# Patient Record
Sex: Male | Born: 1980 | Race: Black or African American | Hispanic: No | Marital: Married | Smoking: Never smoker
Health system: Southern US, Community
[De-identification: ages and names within clinical notes are randomized; demographics above are authoritative.]

## PROBLEM LIST (undated history)

## (undated) HISTORY — PX: BACK SURGERY: SHX140

---

## 2003-10-16 ENCOUNTER — Emergency Department (HOSPITAL_COMMUNITY): Admission: EM | Admit: 2003-10-16 | Discharge: 2003-10-17 | Payer: Self-pay | Admitting: *Deleted

## 2003-11-18 ENCOUNTER — Ambulatory Visit (HOSPITAL_COMMUNITY): Admission: RE | Admit: 2003-11-18 | Discharge: 2003-11-18 | Payer: Self-pay | Admitting: *Deleted

## 2004-02-02 ENCOUNTER — Emergency Department (HOSPITAL_COMMUNITY): Admission: EM | Admit: 2004-02-02 | Discharge: 2004-02-02 | Payer: Self-pay | Admitting: Emergency Medicine

## 2004-08-23 ENCOUNTER — Ambulatory Visit (HOSPITAL_COMMUNITY): Admission: RE | Admit: 2004-08-23 | Discharge: 2004-08-23 | Payer: Self-pay | Admitting: Family Medicine

## 2004-08-26 ENCOUNTER — Emergency Department (HOSPITAL_COMMUNITY): Admission: EM | Admit: 2004-08-26 | Discharge: 2004-08-27 | Payer: Self-pay | Admitting: Emergency Medicine

## 2012-02-16 ENCOUNTER — Emergency Department (HOSPITAL_COMMUNITY)
Admission: EM | Admit: 2012-02-16 | Discharge: 2012-02-16 | Disposition: A | Discharge status: Home / Self Care | Payer: No Typology Code available for payment source | Attending: Emergency Medicine | Admitting: Emergency Medicine

## 2012-02-16 ENCOUNTER — Encounter (HOSPITAL_COMMUNITY): Payer: Self-pay

## 2012-02-16 DIAGNOSIS — M549 Dorsalgia, unspecified: Secondary | ICD-10-CM | POA: Insufficient documentation

## 2012-02-16 DIAGNOSIS — T148XXA Other injury of unspecified body region, initial encounter: Secondary | ICD-10-CM | POA: Insufficient documentation

## 2012-02-16 DIAGNOSIS — X58XXXA Exposure to other specified factors, initial encounter: Secondary | ICD-10-CM | POA: Insufficient documentation

## 2012-02-16 MED ORDER — IBUPROFEN 800 MG PO TABS
800.0000 mg | ORAL_TABLET | Freq: Once | ORAL | Status: AC
  Administered 2012-02-16: 800 mg via ORAL
  Filled 2012-02-16: qty 1

## 2012-02-16 MED ORDER — IBUPROFEN 800 MG PO TABS: 800.0000 mg | ORAL_TABLET | Freq: Three times a day (TID) | ORAL | Status: DC

## 2012-02-16 NOTE — ED Provider Notes (Signed)
History     CSN: [id number]  Arrival date & time 02/16/12  0042   First MD Initiated Contact with Patient 02/16/12 0106      Chief Complaint  Patient presents with  . Back Pain    (Consider location/radiation/quality/duration/timing/severity/associated sxs/prior treatment) HPI  Jerome Sutton is a 31 y.o. male who presents to the Emergency Department complaining of right sided lower back pain that began with lifting boxes onto pallets earlier today. He has taken tylenol without relief. Pain worse with movement. Denies numbness, tingling, weakness of leg.    History reviewed. No pertinent past medical history.  History reviewed. No pertinent past surgical history.  No family history on file.  History  Substance Use Topics  . Smoking status: Never Smoker   . Smokeless tobacco: Not on file  . Alcohol Use: No      Review of Systems  Constitutional: Negative for fever.       10 Systems reviewed and are negative for acute change except as noted in the HPI.  HENT: Negative for congestion.   Eyes: Negative for discharge and redness.  Respiratory: Negative for cough and shortness of breath.   Cardiovascular: Negative for chest pain.  Gastrointestinal: Negative for vomiting and abdominal pain.  Musculoskeletal: Positive for back pain.  Skin: Negative for rash.  Neurological: Negative for syncope, numbness and headaches.  Psychiatric/Behavioral:       No behavior change.    Allergies  Review of patient's allergies indicates not on file.  Home Medications  No current outpatient prescriptions on file.  BP 146/95  Pulse 97  Temp 98.2 F (36.8 C) (Oral)  Resp 18  Ht 5\' 7"  (1.702 m)  Wt 255 lb (115.667 kg)  BMI 39.94 kg/m2  SpO2 96%  Physical Exam  Nursing note and vitals reviewed. Constitutional: He appears well-developed and well-nourished.       Awake, alert, nontoxic appearance.  HENT:  Head: Atraumatic.  Eyes: Right eye exhibits no discharge. Left eye  exhibits no discharge.  Neck: Neck supple.  Cardiovascular: Normal heart sounds.   Pulmonary/Chest: Effort normal and breath sounds normal. He exhibits no tenderness.  Abdominal: Soft. There is no tenderness. There is no rebound.  Musculoskeletal: He exhibits no tenderness.       Baseline ROM, no obvious new focal weakness.Mild tenderness to palpation over the right lower lower back. Able to bend over toward his toes. Lateral bending with mild discomfort.   Neurological:       Mental status and motor strength appears baseline for patient and situation.  Skin: No rash noted.  Psychiatric: He has a normal mood and affect.    ED Course  Procedures (including critical care time)    MDM  Patient with lower back pain as a result of moving boxes. Given ibuprofen. Pt stable in ED with no significant deterioration in condition.The patient appears reasonably screened and/or stabilized for discharge and I doubt any other medical condition or other [facility] requiring further screening, evaluation, or treatment in the ED at this time prior to discharge.  MDM Reviewed: nursing note and vitals           Nicoletta Dress. Colon Branch, MD 02/16/12 [id number]

## 2012-02-16 NOTE — ED Notes (Signed)
Was at work, moving boxes on a pallet, and it started there and got worse per pt.

## 2012-02-16 NOTE — ED Notes (Signed)
Requested a note to be out of work this weekend Saturday and Sunday.  Provided.

## 2012-02-17 ENCOUNTER — Emergency Department (HOSPITAL_COMMUNITY)
Admission: EM | Admit: 2012-02-17 | Discharge: 2012-02-17 | Disposition: A | Discharge status: Home / Self Care | Payer: No Typology Code available for payment source | Attending: Emergency Medicine | Admitting: Emergency Medicine

## 2012-02-17 ENCOUNTER — Encounter (HOSPITAL_COMMUNITY): Payer: Self-pay | Admitting: *Deleted

## 2012-02-17 ENCOUNTER — Emergency Department (HOSPITAL_COMMUNITY): Payer: No Typology Code available for payment source

## 2012-02-17 DIAGNOSIS — S39012A Strain of muscle, fascia and tendon of lower back, initial encounter: Secondary | ICD-10-CM

## 2012-02-17 DIAGNOSIS — M549 Dorsalgia, unspecified: Secondary | ICD-10-CM | POA: Insufficient documentation

## 2012-02-17 MED ORDER — HYDROCODONE-ACETAMINOPHEN 5-325 MG PO TABS
2.0000 | ORAL_TABLET | Freq: Once | ORAL | Status: AC
  Administered 2012-02-17: 2 via ORAL
  Filled 2012-02-17: qty 2

## 2012-02-17 MED ORDER — PREDNISONE 20 MG PO TABS: ORAL_TABLET | ORAL | Status: DC

## 2012-02-17 MED ORDER — PREDNISONE 20 MG PO TABS
60.0000 mg | ORAL_TABLET | Freq: Once | ORAL | Status: AC
  Administered 2012-02-17: 60 mg via ORAL
  Filled 2012-02-17: qty 3

## 2012-02-17 MED ORDER — HYDROCODONE-ACETAMINOPHEN 5-500 MG PO TABS: 1.0000 | ORAL_TABLET | Freq: Four times a day (QID) | ORAL | Status: DC | PRN

## 2012-02-17 NOTE — ED Provider Notes (Signed)
History     CSN: [id number]  Arrival date & time 02/17/12  7829   First MD Initiated Contact with Patient 02/17/12 0654      Chief Complaint  Patient presents with  . Back Pain    (Consider location/radiation/quality/duration/timing/severity/associated sxs/prior treatment) Patient is a 31 y.o. male presenting with back pain. The history is provided by the patient.  Back Pain  Pertinent negatives include no fever, no numbness, no headaches, no abdominal pain and no weakness.  pt c/o lower back pain radiating to right leg for past 2 days. States 2 days ago at work was doing a lot of bending/lifting-  Felt as if strained back. Hx intermittent back pain in past, no prior back surgery. No numbness or weakness. No incontinence or retention. Denies fever or chills. Went to [facility] ED yesterday. States the ibuprofen is not controlling pain and that no xrays were done.   History reviewed. No pertinent past medical history.  History reviewed. No pertinent past surgical history.  History reviewed. No pertinent family history.  History  Substance Use Topics  . Smoking status: Never Smoker   . Smokeless tobacco: Not on file  . Alcohol Use: No      Review of Systems  Constitutional: Negative for fever and chills.  Cardiovascular: Negative for leg swelling.  Gastrointestinal: Negative for nausea, vomiting and abdominal pain.  Musculoskeletal: Positive for back pain.  Skin: Negative for rash.  Neurological: Negative for weakness, numbness and headaches.    Allergies  Review of patient's allergies indicates no known allergies.  Home Medications  No current outpatient prescriptions on file.  BP 189/95  Pulse 101  Temp 98.6 F (37 C) (Oral)  Resp 16  SpO2 97%  Physical Exam  Nursing note and vitals reviewed. Constitutional: He is oriented to person, place, and time. He appears well-developed and well-nourished. No distress.  HENT:  Head: Atraumatic.  Nose: Nose normal.  Eyes:  Conjunctivae are normal.  Neck: Neck supple. No tracheal deviation present.  Cardiovascular: Normal rate and intact distal pulses.   Pulmonary/Chest: Effort normal. No accessory muscle usage. No respiratory distress.  Abdominal: Soft. He exhibits no distension. There is no tenderness.  Genitourinary:       No cva tenderness  Musculoskeletal: Normal range of motion. He exhibits no edema and no tenderness.       Diffuse lumbar tenderness, otherwise, CTLS spine, non tender, aligned, no step off.   Neurological: He is alert and oriented to person, place, and time. He displays normal reflexes.       Straight leg raise neg. Motor intact bil. Steady gait.   Skin: Skin is warm and dry.  Psychiatric: He has a normal mood and affect.    ED Course  Procedures (including critical care time)  Dg Lumbar Spine Complete  02/17/2012  *RADIOLOGY REPORT*  Clinical Data: Back pain.  LUMBAR SPINE - COMPLETE 4+ VIEW  Comparison: None.  Findings: AP, lateral and oblique images of the lumbar spine were obtained.  There are mild degenerative endplate changes at L5-S1. The vertebral body heights are maintained.  Normal alignment of the lumbar spine.  No evidence for acute fracture.  IMPRESSION: No acute bony abnormality.   Original Report Authenticated By: Richarda Overlie, M.D.       MDM  Pt has ride, does not have to drive. vicodin po. pred po. Xr.    Recheck pt comfortable.     Suzi Roots, MD 02/17/12 [phone]

## 2012-02-17 NOTE — ED Notes (Signed)
MD at bedside. 

## 2012-02-17 NOTE — ED Notes (Signed)
Pt states back pain since this past Friday at work. Pt states intermitant pain and mid back and down right leg.

## 2012-02-18 ENCOUNTER — Encounter (HOSPITAL_COMMUNITY): Payer: Self-pay

## 2012-02-18 ENCOUNTER — Encounter (HOSPITAL_COMMUNITY): Payer: Self-pay | Admitting: Certified Registered"

## 2012-02-18 ENCOUNTER — Emergency Department (HOSPITAL_COMMUNITY): Payer: No Typology Code available for payment source | Admitting: Certified Registered"

## 2012-02-18 ENCOUNTER — Emergency Department (HOSPITAL_COMMUNITY): Payer: No Typology Code available for payment source

## 2012-02-18 ENCOUNTER — Encounter (HOSPITAL_COMMUNITY)
Admission: EM | Disposition: A | Discharge status: Home / Self Care | Payer: Self-pay | Source: Home / Self Care | Attending: Neurosurgery

## 2012-02-18 ENCOUNTER — Inpatient Hospital Stay (HOSPITAL_COMMUNITY)
Admission: EM | Admit: 2012-02-18 | Discharge: 2012-02-20 | DRG: 490 | Disposition: A | Discharge status: Home / Self Care | Payer: No Typology Code available for payment source | Attending: Neurosurgery | Admitting: Neurosurgery

## 2012-02-18 ENCOUNTER — Inpatient Hospital Stay (HOSPITAL_COMMUNITY): Payer: No Typology Code available for payment source

## 2012-02-18 DIAGNOSIS — X500XXA Overexertion from strenuous movement or load, initial encounter: Secondary | ICD-10-CM | POA: Diagnosis present

## 2012-02-18 DIAGNOSIS — G834 Cauda equina syndrome: Secondary | ICD-10-CM

## 2012-02-18 DIAGNOSIS — S33101A Dislocation of unspecified lumbar vertebra, initial encounter: Principal | ICD-10-CM | POA: Diagnosis present

## 2012-02-18 DIAGNOSIS — Y9269 Other specified industrial and construction area as the place of occurrence of the external cause: Secondary | ICD-10-CM

## 2012-02-18 HISTORY — PX: LUMBAR LAMINECTOMY/DECOMPRESSION MICRODISCECTOMY: SHX5026

## 2012-02-18 LAB — CBC WITH DIFFERENTIAL/PLATELET
Basophils Absolute: 0 K/uL (ref 0.0–0.1)
Basophils Relative: 0 % (ref 0–1)
Eosinophils Absolute: 0 10*3/uL (ref 0.0–0.7)
Eosinophils Relative: 0 % (ref 0–5)
HCT: 46 % (ref 39.0–52.0)
Hemoglobin: 15.7 g/dL (ref 13.0–17.0)
Lymphocytes Relative: 5 % — ABNORMAL LOW (ref 12–46)
Lymphs Abs: 0.6 10*3/uL — ABNORMAL LOW (ref 0.7–4.0)
MCH: 29.3 pg (ref 26.0–34.0)
MCHC: 34.1 g/dL (ref 30.0–36.0)
MCV: 85.8 fL (ref 78.0–100.0)
Monocytes Absolute: 0.4 K/uL (ref 0.1–1.0)
Monocytes Relative: 3 % (ref 3–12)
Neutro Abs: 11.9 10*3/uL — ABNORMAL HIGH (ref 1.7–7.7)
Neutrophils Relative %: 93 % — ABNORMAL HIGH (ref 43–77)
Platelets: 244 10*3/uL (ref 150–400)
RBC: 5.36 MIL/uL (ref 4.22–5.81)
RDW: 12.2 % (ref 11.5–15.5)
WBC: 12.8 K/uL — ABNORMAL HIGH (ref 4.0–10.5)

## 2012-02-18 LAB — URINALYSIS, ROUTINE W REFLEX MICROSCOPIC
Bilirubin Urine: NEGATIVE
Glucose, UA: NEGATIVE mg/dL
Hgb urine dipstick: NEGATIVE
Ketones, ur: 15 mg/dL — AB
Leukocytes, UA: NEGATIVE
Nitrite: NEGATIVE
Protein, ur: NEGATIVE mg/dL
Specific Gravity, Urine: 1.028 (ref 1.005–1.030)
Urobilinogen, UA: 1 mg/dL (ref 0.0–1.0)
pH: 6 (ref 5.0–8.0)

## 2012-02-18 LAB — BASIC METABOLIC PANEL
BUN: 14 mg/dL (ref 6–23)
GFR calc Af Amer: >90 mL/min (ref >90)
GFR calc non Af Amer: >90 mL/min (ref >90)
Potassium: 4.4 mEq/L (ref 3.5–5.1)

## 2012-02-18 LAB — BASIC METABOLIC PANEL WITH GFR
CO2: 26 meq/L (ref 19–32)
Calcium: 9.5 mg/dL (ref 8.4–10.5)
Chloride: 101 meq/L (ref 96–112)
Creatinine, Ser: 0.83 mg/dL (ref 0.50–1.35)
Glucose, Bld: 137 mg/dL — ABNORMAL HIGH (ref 70–99)
Sodium: 137 meq/L (ref 135–145)

## 2012-02-18 LAB — PROTIME-INR
INR: 0.99 (ref 0.00–1.49)
Prothrombin Time: 13.3 seconds (ref 11.6–15.2)

## 2012-02-18 LAB — RAPID URINE DRUG SCREEN, HOSP PERFORMED
Amphetamines: NOT DETECTED
Barbiturates: NOT DETECTED
Tetrahydrocannabinol: NOT DETECTED

## 2012-02-18 SURGERY — LUMBAR LAMINECTOMY/DECOMPRESSION MICRODISCECTOMY 1 LEVEL
Anesthesia: General | Site: Back | Laterality: Bilateral | Wound class: Clean

## 2012-02-18 MED ORDER — GLYCOPYRROLATE 0.2 MG/ML IJ SOLN
INTRAMUSCULAR | Status: DC | PRN
  Administered 2012-02-18: .8 mg via INTRAVENOUS

## 2012-02-18 MED ORDER — ONDANSETRON HCL 4 MG/2ML IJ SOLN
INTRAMUSCULAR | Status: DC | PRN
  Administered 2012-02-18: 4 mg via INTRAVENOUS

## 2012-02-18 MED ORDER — NEOSTIGMINE METHYLSULFATE 1 MG/ML IJ SOLN
INTRAMUSCULAR | Status: DC | PRN
  Administered 2012-02-18: 5 mg via INTRAVENOUS

## 2012-02-18 MED ORDER — MORPHINE SULFATE 4 MG/ML IJ SOLN
4.0000 mg | INTRAMUSCULAR | Status: DC | PRN
  Administered 2012-02-18: 4 mg via INTRAVENOUS
  Filled 2012-02-18: qty 1

## 2012-02-18 MED ORDER — LIDOCAINE HCL (CARDIAC) 20 MG/ML IV SOLN
INTRAVENOUS | Status: DC | PRN
  Administered 2012-02-18: 40 mg via INTRAVENOUS

## 2012-02-18 MED ORDER — DEXAMETHASONE SODIUM PHOSPHATE 4 MG/ML IJ SOLN
INTRAMUSCULAR | Status: DC | PRN
  Administered 2012-02-18: 10 mg via INTRAVENOUS

## 2012-02-18 MED ORDER — HEMOSTATIC AGENTS (NO CHARGE) OPTIME
TOPICAL | Status: DC | PRN
  Administered 2012-02-18: 1 via TOPICAL

## 2012-02-18 MED ORDER — VECURONIUM BROMIDE 10 MG IV SOLR
INTRAVENOUS | Status: DC | PRN
  Administered 2012-02-18: 1 mg via INTRAVENOUS

## 2012-02-18 MED ORDER — OXYCODONE HCL 5 MG PO TABS: 5.0000 mg | ORAL_TABLET | Freq: Once | ORAL | Status: DC | PRN

## 2012-02-18 MED ORDER — CEFAZOLIN SODIUM 1-5 GM-% IV SOLN
INTRAVENOUS | Status: DC | PRN
  Administered 2012-02-18: 3 g via INTRAVENOUS

## 2012-02-18 MED ORDER — LIDOCAINE-EPINEPHRINE 1 %-1:100000 IJ SOLN
INTRAMUSCULAR | Status: DC | PRN
  Administered 2012-02-18: 10 mL via INTRADERMAL

## 2012-02-18 MED ORDER — ROCURONIUM BROMIDE 100 MG/10ML IV SOLN
INTRAVENOUS | Status: DC | PRN
  Administered 2012-02-18: 50 mg via INTRAVENOUS

## 2012-02-18 MED ORDER — PROMETHAZINE HCL 25 MG/ML IJ SOLN: 6.2500 mg | Freq: Once | INTRAMUSCULAR | Status: DC

## 2012-02-18 MED ORDER — PROPOFOL 10 MG/ML IV EMUL
INTRAVENOUS | Status: DC | PRN
  Administered 2012-02-18: 200 mg via INTRAVENOUS

## 2012-02-18 MED ORDER — SODIUM CHLORIDE 0.9 % IV SOLN
Freq: Once | INTRAVENOUS | Status: AC
  Administered 2012-02-18 (×2): via INTRAVENOUS

## 2012-02-18 MED ORDER — 0.9 % SODIUM CHLORIDE (POUR BTL) OPTIME
TOPICAL | Status: DC | PRN
  Administered 2012-02-18: 1000 mL

## 2012-02-18 MED ORDER — HYDROMORPHONE HCL PF 1 MG/ML IJ SOLN
0.2500 mg | INTRAMUSCULAR | Status: DC | PRN
  Administered 2012-02-18 (×2): 0.5 mg via INTRAVENOUS

## 2012-02-18 MED ORDER — PHENYLEPHRINE HCL 10 MG/ML IJ SOLN
INTRAMUSCULAR | Status: DC | PRN
  Administered 2012-02-18: 40 ug via INTRAVENOUS
  Administered 2012-02-18: 80 ug via INTRAVENOUS

## 2012-02-18 MED ORDER — THROMBIN 5000 UNITS EX KIT
PACK | CUTANEOUS | Status: DC | PRN
  Administered 2012-02-18 (×2): 5000 [IU] via TOPICAL

## 2012-02-18 MED ORDER — MIDAZOLAM HCL 5 MG/5ML IJ SOLN
INTRAMUSCULAR | Status: DC | PRN
  Administered 2012-02-18: 2 mg via INTRAVENOUS

## 2012-02-18 MED ORDER — OXYCODONE HCL 5 MG/5ML PO SOLN: 5.0000 mg | Freq: Once | ORAL | Status: DC | PRN

## 2012-02-18 MED ORDER — FENTANYL CITRATE 0.05 MG/ML IJ SOLN
INTRAMUSCULAR | Status: DC | PRN
  Administered 2012-02-18: 50 ug via INTRAVENOUS
  Administered 2012-02-18 (×2): 100 ug via INTRAVENOUS

## 2012-02-18 MED ORDER — HYDROMORPHONE HCL PF 1 MG/ML IJ SOLN
INTRAMUSCULAR | Status: AC
  Administered 2012-02-18: 0.5 mg via INTRAVENOUS
  Filled 2012-02-18: qty 1

## 2012-02-18 MED ORDER — SODIUM CHLORIDE 0.9 % IR SOLN
Status: DC | PRN
  Administered 2012-02-18: 22:00:00

## 2012-02-18 MED ORDER — BUPIVACAINE HCL (PF) 0.25 % IJ SOLN
INTRAMUSCULAR | Status: DC | PRN
  Administered 2012-02-18: 10 mL

## 2012-02-18 SURGICAL SUPPLY — 57 items
ADH SKN CLS APL DERMABOND .7 (GAUZE/BANDAGES/DRESSINGS) ×1
APL SKNCLS STERI-STRIP NONHPOA (GAUZE/BANDAGES/DRESSINGS) ×1
BAG DECANTER FOR FLEXI CONT (MISCELLANEOUS) ×2 IMPLANT
BENZOIN TINCTURE PRP APPL 2/3 (GAUZE/BANDAGES/DRESSINGS) ×2 IMPLANT
BLADE SURG 11 STRL SS (BLADE) ×2 IMPLANT
BLADE SURG ROTATE 9660 (MISCELLANEOUS) ×2 IMPLANT
BRUSH SCRUB EZ PLAIN DRY (MISCELLANEOUS) ×2 IMPLANT
BUR CUTTER 7.0 ROUND (BURR) ×2 IMPLANT
BUR MATCHSTICK NEURO 3.0 LAGG (BURR) ×2 IMPLANT
BUR PRECISION FLUTE 6.0 (BURR) ×1 IMPLANT
CANISTER SUCTION 2500CC (MISCELLANEOUS) ×2 IMPLANT
CLOTH BEACON ORANGE TIMEOUT ST (SAFETY) ×2 IMPLANT
CONT SPEC 4OZ CLIKSEAL STRL BL (MISCELLANEOUS) ×2 IMPLANT
DECANTER SPIKE VIAL GLASS SM (MISCELLANEOUS) ×2 IMPLANT
DERMABOND ADVANCED (GAUZE/BANDAGES/DRESSINGS) ×1
DERMABOND ADVANCED .7 DNX12 (GAUZE/BANDAGES/DRESSINGS) ×1 IMPLANT
DRAPE LAPAROTOMY 100X72X124 (DRAPES) ×2 IMPLANT
DRAPE MICROSCOPE LEICA (MISCELLANEOUS) ×1 IMPLANT
DRAPE MICROSCOPE ZEISS OPMI (DRAPES) IMPLANT
DRAPE POUCH INSTRU U-SHP 10X18 (DRAPES) ×2 IMPLANT
DRAPE PROXIMA HALF (DRAPES) IMPLANT
DRAPE SURG 17X23 STRL (DRAPES) ×2 IMPLANT
DRSG OPSITE 4X5.5 SM (GAUZE/BANDAGES/DRESSINGS) ×2 IMPLANT
ELECT REM PT RETURN 9FT ADLT (ELECTROSURGICAL) ×2
ELECTRODE REM PT RTRN 9FT ADLT (ELECTROSURGICAL) ×1 IMPLANT
EVACUATOR 1/8 PVC DRAIN (DRAIN) ×1 IMPLANT
GAUZE SPONGE 4X4 16PLY XRAY LF (GAUZE/BANDAGES/DRESSINGS) ×1 IMPLANT
GLOVE BIO SURGEON STRL SZ 6.5 (GLOVE) ×2 IMPLANT
GLOVE BIO SURGEON STRL SZ7 (GLOVE) ×2 IMPLANT
GLOVE BIO SURGEON STRL SZ8 (GLOVE) ×2 IMPLANT
GLOVE EXAM NITRILE LRG STRL (GLOVE) IMPLANT
GLOVE EXAM NITRILE MD LF STRL (GLOVE) ×2 IMPLANT
GLOVE EXAM NITRILE XL STR (GLOVE) IMPLANT
GLOVE EXAM NITRILE XS STR PU (GLOVE) IMPLANT
GLOVE INDICATOR 6.5 STRL GRN (GLOVE) ×2 IMPLANT
GLOVE INDICATOR 8.5 STRL (GLOVE) ×2 IMPLANT
GOWN BRE IMP SLV AUR LG STRL (GOWN DISPOSABLE) ×4 IMPLANT
GOWN BRE IMP SLV AUR XL STRL (GOWN DISPOSABLE) ×4 IMPLANT
GOWN STRL REIN 2XL LVL4 (GOWN DISPOSABLE) IMPLANT
KIT BASIN OR (CUSTOM PROCEDURE TRAY) ×2 IMPLANT
KIT ROOM TURNOVER OR (KITS) ×2 IMPLANT
NEEDLE HYPO 22GX1.5 SAFETY (NEEDLE) ×2 IMPLANT
NS IRRIG 1000ML POUR BTL (IV SOLUTION) ×2 IMPLANT
PACK LAMINECTOMY NEURO (CUSTOM PROCEDURE TRAY) ×2 IMPLANT
RUBBERBAND STERILE (MISCELLANEOUS) ×4 IMPLANT
SPONGE GAUZE 4X4 12PLY (GAUZE/BANDAGES/DRESSINGS) ×2 IMPLANT
SPONGE SURGIFOAM ABS GEL SZ50 (HEMOSTASIS) ×2 IMPLANT
STRIP CLOSURE SKIN 1/2X4 (GAUZE/BANDAGES/DRESSINGS) ×2 IMPLANT
SUT VIC AB 0 CT1 18XCR BRD8 (SUTURE) ×1 IMPLANT
SUT VIC AB 0 CT1 8-18 (SUTURE) ×4
SUT VIC AB 2-0 CT1 18 (SUTURE) ×2 IMPLANT
SUT VICRYL 4-0 PS2 18IN ABS (SUTURE) ×2 IMPLANT
SYR 20ML ECCENTRIC (SYRINGE) ×2 IMPLANT
TAPE STRIPS DRAPE STRL (GAUZE/BANDAGES/DRESSINGS) ×1 IMPLANT
TOWEL OR 17X24 6PK STRL BLUE (TOWEL DISPOSABLE) ×2 IMPLANT
TOWEL OR 17X26 10 PK STRL BLUE (TOWEL DISPOSABLE) ×2 IMPLANT
WATER STERILE IRR 1000ML POUR (IV SOLUTION) ×2 IMPLANT

## 2012-02-18 NOTE — ED Provider Notes (Signed)
Medical screening examination/treatment/procedure(s) were performed by non-physician practitioner and as supervising physician I was immediately available for consultation/collaboration.   Gwyneth Sprout, MD 02/18/12 2153

## 2012-02-18 NOTE — ED Notes (Signed)
Patient bladder scanned for greater than 826cc of urine before voided. Patient's post void residual was 571cc.

## 2012-02-18 NOTE — Transfer of Care (Signed)
Immediate Anesthesia Transfer of Care Note  Patient: Jerome Sutton  Procedure(s) Performed: Procedure(s) (LRB): LUMBAR LAMINECTOMY/DECOMPRESSION MICRODISCECTOMY 1 LEVEL (Bilateral)  Patient Location: PACU  Anesthesia Type: General  Level of Consciousness: awake, alert  and oriented  Airway & Oxygen Therapy: Patient Spontanous Breathing and Patient connected to nasal cannula oxygen  Post-op Assessment: Report given to PACU RN, Post -op Vital signs reviewed and stable and Patient moving all extremities X 4  Post vital signs: Reviewed and stable  Complications: No apparent anesthesia complications

## 2012-02-18 NOTE — Preoperative (Signed)
Beta Blockers   Reason not to administer Beta Blockers:Not Applicable 

## 2012-02-18 NOTE — ED Provider Notes (Signed)
History   This chart was scribed for Jerome Sutton. Jerome Lamas, MD by Jerome Sutton. The patient was seen in room TR09C/TR09C and the patient's care was started at 3:05PM.    CSN: [id number]  Arrival date & time 02/18/12  1408   None     Chief Complaint  Patient presents with  . Urinary Retention    (Consider location/radiation/quality/duration/timing/severity/associated sxs/prior treatment) The history is provided by the patient. No language interpreter was used.   Jerome Sutton is a 31 y.o. male who presents to the Emergency Department complaining of persistent, moderate urinary and bowel dysfunction with an onset yesterday. Pt was [facility] at the [facility] ED for similar symptoms. Pt injured his back during work. Pt was given pain meds and prednisone. Back pain is now gone; pain meds alleviated the pain. Pt states that he has urinary retention and can not have a bowel movement. Numbness and tingling in buttocks and legs that skips the thighs but goes to the feet present. No IV drug use. No HA, fever, neck pain, sore throat, rash, CP, SOB, abd pain, n/v/d, dysuria, or extremity pain, edema, or weakness. Pt has a Hx of back pain; no Hx of surgery. No known allergies. No other pertinent medical symptoms.  No past medical history on file.  No past surgical history on file.  No family history on file.  History  Substance Use Topics  . Smoking status: Never Smoker   . Smokeless tobacco: Not on file  . Alcohol Use: No      Review of Systems  Constitutional: Positive for chills.  Gastrointestinal: Negative for nausea, vomiting and abdominal pain.  Genitourinary: Positive for difficulty urinating. Negative for frequency and flank pain.  Musculoskeletal: Positive for back pain.  Neurological: Positive for numbness. Negative for weakness.  All other systems reviewed and are negative.      Allergies  Review of patient's allergies indicates no known allergies.  Home  Medications   Current Outpatient Rx  Name Route Sig Dispense Refill  . HYDROCODONE-ACETAMINOPHEN 5-500 MG PO TABS Oral Take 1-2 tablets by mouth every 6 (six) hours as needed. Depends on pain if he takes 1 or 2 tablets.    . IBUPROFEN 800 MG PO TABS Oral Take 800 mg by mouth every 8 (eight) hours as needed. pain    . PREDNISONE 20 MG PO TABS Oral Take 10-30 mg by mouth daily. Start 3 tablets once a day for 2 days, then 2 tablets once a day for 3 days, then 1 tablet once a day for 3 days. Started taking 02-17-12 for back inflammation      BP 146/78  Pulse 105  Temp 99.8 F (37.7 C) (Oral)  Resp 18  SpO2 97%  Physical Exam  Nursing note and vitals reviewed. Constitutional: He is oriented to person, place, and time. He appears well-developed and well-nourished. No distress.  HENT:  Head: Normocephalic and atraumatic.  Eyes: EOM are normal. Pupils are equal, round, and reactive to light. No scleral icterus.  Neck: Normal range of motion. Neck supple. No tracheal deviation present.  Cardiovascular: Normal rate and regular rhythm.   Pulmonary/Chest: Effort normal. No respiratory distress.  Abdominal: Soft. He exhibits no distension. There is no tenderness. There is no rebound.  Musculoskeletal: Normal range of motion. He exhibits tenderness (minimal paraspinal lumbar tendeness).       Lumbar back: He exhibits tenderness. He exhibits normal range of motion, no bony tenderness, no deformity, no pain, no spasm and  normal pulse.  Neurological: He is alert and oriented to person, place, and time. He displays no Babinski's sign on the right side. He displays no Babinski's sign on the left side.  Reflex Scores:      Patellar reflexes are 0 on the right side and 0 on the left side.      Nml strength in bilateral lower extremities.  Skin: Skin is warm and dry.  Psychiatric: He has a normal mood and affect. His behavior is normal.    ED Course  Procedures (including critical care  time)  DIAGNOSTIC STUDIES: Oxygen Saturation is 97% on room air, normal by my interpretation.    COORDINATION OF CARE:  3:10PM - pt will undergo a post-void residual test.   Labs Reviewed - No data to display Dg Lumbar Spine Complete  02/17/2012  *RADIOLOGY REPORT*  Clinical Data: Back pain.  LUMBAR SPINE - COMPLETE 4+ VIEW  Comparison: None.  Findings: AP, lateral and oblique images of the lumbar spine were obtained.  There are mild degenerative endplate changes at L5-S1. The vertebral body heights are maintained.  Normal alignment of the lumbar spine.  No evidence for acute fracture.  IMPRESSION: No acute bony abnormality.   Original Report Authenticated By: Richarda Overlie, M.D.      No diagnosis found.    MDM  I personally performed the services described in this documentation, which was scribed in my presence. The recorded information has been reviewed and considered.  Pt with non traumatic low back pain, now with subjective numbness to pelvis and skips thighs, tingling and numbness to bottoms of feet and along medial portions of feet and part of big toes.  Able to walk, low back pain improved, post void residual, he has retained well over 500 ml.  Will put in CDU, obtain MRI of lumbar spine and consult NSU pending results.         Jerome Sutton. Jerome Giammarco, MD 02/18/12 1620

## 2012-02-18 NOTE — Anesthesia Procedure Notes (Signed)
Procedure Name: Intubation Date/Time: 02/18/2012 8:56 PM Performed by: Jefm Miles E Pre-anesthesia Checklist: Patient identified, Timeout performed, Emergency Drugs available, Suction available and Patient being monitored Patient Re-evaluated:Patient Re-evaluated prior to inductionOxygen Delivery Method: Circle system utilized Preoxygenation: Pre-oxygenation with 100% oxygen Intubation Type: IV induction Ventilation: Mask ventilation without difficulty Laryngoscope Size: Mac and 3 Grade View: Grade I Tube type: Oral Tube size: 7.5 mm Number of attempts: 1 Airway Equipment and Method: Stylet Placement Confirmation: ETT inserted through vocal cords under direct vision,  breath sounds checked- equal and bilateral and positive ETCO2 Secured at: 21 cm Tube secured with: Tape Dental Injury: Teeth and Oropharynx as per pre-operative assessment

## 2012-02-18 NOTE — Anesthesia Postprocedure Evaluation (Signed)
  Anesthesia Post-op Note  Patient: Jerome Sutton  Procedure(s) Performed: Procedure(s) (LRB): LUMBAR LAMINECTOMY/DECOMPRESSION MICRODISCECTOMY 1 LEVEL (Bilateral)  Patient Location: PACU  Anesthesia Type: General  Level of Consciousness: awake and alert   Airway and Oxygen Therapy: Patient Spontanous Breathing  Post-op Pain: mild  Post-op Assessment: Post-op Vital signs reviewed, Patient's Cardiovascular Status Stable, Respiratory Function Stable, Patent Airway and No signs of Nausea or vomiting  Post-op Vital Signs: Reviewed and stable  Complications: No apparent anesthesia complications

## 2012-02-18 NOTE — ED Provider Notes (Signed)
Patient care passed on from Dr. Oletta Lamas. 31 y/o male moved to CDU. Came to ED for urinary retention s/p back injury. Admits to saddle anesthesia. Awaiting MRI results. 7:05 PM MRI showing severe disc protrusion at L5-S1. Patient admits to saddle anesthesia. Admits to numbness down both extremities without tingling or pain. Denies any back pain. Strength equal b/l lower extremities, 4/5. No sensory deficits present. Will consult neurosurgery. Case discussed with Dr. Anitra Lauth who agrees with plan of care.  7:59 PM Patient admitted to neurosurgery for surgery for cauda equina.  Trevor Mace, PA-C 02/18/12 [phone]

## 2012-02-18 NOTE — ED Notes (Signed)
Seen yesterday, injured back and had numbness in legs and bottom, sts unable to have bowel movement now. sts he is urinating.

## 2012-02-18 NOTE — H&P (Signed)
Jerome Sutton is an 31 y.o. male.   Chief Complaint: Back and bilateral leg pain HPI: This is a gentleman who injured his back at work 2 days ago while lifting something and an immediate back and bilateral leg pain worse on the left at present he any pain and restore yesterday was evaluated was placed on prednisone anti-inflammatories this alleviates her as pain however over last week there is a progress worsening difficulty with bowel bladder control and inability to go to present back to the emergency department was evaluated with MRI scan showed large disc herniation underwent a bladder scan that showed he was retaining or 500 cc urine and patient subsequently now been recommended laminectomy and discectomy. Excess reviewed the risks and benefits of a bilateral L5-S1 laminectomy and discectomy as well as perioperative course and expectations of outcome of his surgery the patient and his family who are in the room present understand and agree to proceed forward.  History reviewed. No pertinent past medical history.  History reviewed. No pertinent past surgical history.  History reviewed. No pertinent family history. Social History:  reports that he has never smoked. He does not have any smokeless tobacco history on file. He reports that he does not drink alcohol or use illicit drugs.  Allergies: No Known Allergies   (Not in a hospital admission)  Results for orders placed during the hospital encounter of 02/18/12 (from the past 48 hour(s))  URINALYSIS, ROUTINE W REFLEX MICROSCOPIC     Status: Abnormal   Collection Time   02/18/12  4:02 PM      Component Value Range Comment   Color, Urine YELLOW  YELLOW    APPearance CLEAR  CLEAR    Specific Gravity, Urine 1.028  1.005 - 1.030    pH 6.0  5.0 - 8.0    Glucose, UA NEGATIVE  NEGATIVE mg/dL    Hgb urine dipstick NEGATIVE  NEGATIVE    Bilirubin Urine NEGATIVE  NEGATIVE    Ketones, ur 15 (*) NEGATIVE mg/dL    Protein, ur NEGATIVE  NEGATIVE  mg/dL    Urobilinogen, UA 1.0  0.0 - 1.0 mg/dL    Nitrite NEGATIVE  NEGATIVE    Leukocytes, UA NEGATIVE  NEGATIVE MICROSCOPIC NOT DONE ON URINES WITH NEGATIVE PROTEIN, BLOOD, LEUKOCYTES, NITRITE, OR GLUCOSE <1000 mg/dL.  CBC WITH DIFFERENTIAL     Status: Abnormal   Collection Time   02/18/12  4:25 PM      Component Value Range Comment   WBC 12.8 (*) 4.0 - 10.5 K/uL    RBC 5.36  4.22 - 5.81 MIL/uL    Hemoglobin 15.7  13.0 - 17.0 g/dL    HCT 16.1  09.6 - 04.5 %    MCV 85.8  78.0 - 100.0 fL    MCH 29.3  26.0 - 34.0 pg    MCHC 34.1  30.0 - 36.0 g/dL    RDW 40.9  81.1 - 91.4 %    Platelets 244  150 - 400 K/uL    Neutrophils Relative 93 (*) 43 - 77 %    Neutro Abs 11.9 (*) 1.7 - 7.7 K/uL    Lymphocytes Relative 5 (*) 12 - 46 %    Lymphs Abs 0.6 (*) 0.7 - 4.0 K/uL    Monocytes Relative 3  3 - 12 %    Monocytes Absolute 0.4  0.1 - 1.0 K/uL    Eosinophils Relative 0  0 - 5 %    Eosinophils Absolute 0.0  0.0 -  0.7 K/uL    Basophils Relative 0  0 - 1 %    Basophils Absolute 0.0  0.0 - 0.1 K/uL   BASIC METABOLIC PANEL     Status: Abnormal   Collection Time   02/18/12  4:25 PM      Component Value Range Comment   Sodium 137  135 - 145 mEq/L    Potassium 4.4  3.5 - 5.1 mEq/L    Chloride 101  96 - 112 mEq/L    CO2 26  19 - 32 mEq/L    Glucose, Bld 137 (*) 70 - 99 mg/dL    BUN 14  6 - 23 mg/dL    Creatinine, Ser 4.54  0.50 - 1.35 mg/dL    Calcium 9.5  8.4 - 09.8 mg/dL    GFR calc non Af Amer >90  >90 mL/min    GFR calc Af Amer >90  >90 mL/min    Dg Lumbar Spine Complete  02/17/2012  *RADIOLOGY REPORT*  Clinical Data: Back pain.  LUMBAR SPINE - COMPLETE 4+ VIEW  Comparison: None.  Findings: AP, lateral and oblique images of the lumbar spine were obtained.  There are mild degenerative endplate changes at L5-S1. The vertebral body heights are maintained.  Normal alignment of the lumbar spine.  No evidence for acute fracture.  IMPRESSION: No acute bony abnormality.   Original Report  Authenticated By: Richarda Overlie, M.D.    Mr Lumbar Spine Wo Contrast  02/18/2012  *RADIOLOGY REPORT*  Clinical Data: Severe low back pain radiating into the right leg.  MRI LUMBAR SPINE WITHOUT CONTRAST  Technique:  Multiplanar and multiecho pulse sequences of the lumbar spine were obtained without intravenous contrast.  Comparison: 02/17/2012.  Findings: Vertebral body height is maintained.  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 Degenerative endplate signal change is noted at L5-S1.  The conus medullaris is normal in signal and position.  Imaged intra- abdominal contents demonstrate distention of the urinary bladder.  The T11-12 and T12-L1 levels are imaged in the sagittal plane only. Minimal disc bulging is present at T11-12 but the central canal and foramina appear open at each level.  L1-2:  Negative.  L2-3:  There is some facet degenerative disease and ligamentum flavum thickening.  No disc bulge or protrusion.  Central canal and foramina are open.  L3-4:  There is facet arthropathy with a 0.7 cm in diameter synovial cyst off the posterior aspect of the left facet joint. Slight disc bulge is identified but no acquired central canal or foraminal stenosis is present.  L4-5:  The patient has a mild disc bulge with some facet arthropathy and ligamentum flavum thickening.  There is mild to moderate congenital acquired central canal and lateral recess narrowing with some encroachment on the descending L5 roots. Foramina are mildly narrowed.  L5-S1:  The patient has a very large central disc protrusion obliterating the thecal sac and causing severe narrowing of the lateral recesses.  Foramina are mildly narrowed.  IMPRESSION:  1.  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 2.  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    Original Report Authenticated By: Bernadene Bell. Maricela Curet, M.D.     Review of Systems  Constitutional: Negative.   HENT: Negative.   Eyes: Negative.   Respiratory: Negative.   Cardiovascular: Negative.   Gastrointestinal:  Positive for constipation.  Musculoskeletal: Positive for back pain and joint pain.  Skin: Negative.   Neurological: Positive for tingling and sensory change.  Psychiatric/Behavioral: Negative.     Blood pressure 154/89, pulse 102, temperature 98.9 F (37.2 C), temperature source Oral, resp. rate 14, SpO2 96.00%. Physical Exam  Constitutional: He is oriented to person, place, and time. He appears well-developed and well-nourished.  HENT:  Head: Normocephalic and atraumatic.  Neck: Normal range of motion.  Cardiovascular: Normal rate.   Respiratory: Effort normal.  GI: Soft.  Neurological: He is alert and oriented to person, place, and time.       Strength is 5 of 5 in his iliopsoas, quads, hamstrings, gastrocnemius, anterior tibialis, both EHL is a very weak at 2-3/5 he does have decreased sensation in his perineum and saddle anesthesia     Assessment/Plan 31 year old gentleman presents for an L5-S1 bilateral laminectomy and discectomy for cauda equina syndrome  Somtochukwu Woollard P 02/18/2012, 7:36 PM

## 2012-02-18 NOTE — Op Note (Signed)
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  EBL: Minimal  History of present illness: Patient is a very pleasant 31 year old gentleman presented emergent R. with difficulty urinating and in constipated with pain in both legs and numbness tingling his feet and weakness in his EHL. Workup revealed a large disc herniation L5-S1 urinary retention with 500 cc retainer post for residual and due to patient's progression of clinical syndrome and imaging findings as recommended emergent decompressive laminectomy and discectomy with her as well as other person with him as well as therapy course expectations of outcome alternatives of surgery and he understood and agreed to proceed forward.  Operative procedure: Patient brought into the or was induced under general anesthesia positioned prone the Wilson frame his back was prepped and draped in routine sterile fashion depression localize the appropriate level so after infiltration 10 cc lidocaine with epi a midline incision was made and Bovie light cautery was used taken the subcutaneous tissue subperiosteal dissections care lamina of L5 and S1 bilaterally after interoperative x-ray confirmed the appropriate level the spinous process of L5 was partially removed laminotomy was begun centrally extending more leftward and rightward the ligament was identified and removed in piecemeal fashion thecal sac was markedly tense so after under biting the medial facet complex from the left side to gain access and identification of the S1 pedicle the S1 nerve root the operating next of drape brought in the field and much of illumination the S1 nerve root was dissected off of the disc space there was a large disc bulge still  continuing ligament that was immediately appreciated the disc space is entered and cleaned out however this was not a fragment of the thecal sac was still markedly tight so using a tree cutter gently reflected thecal sac medially I was able to take a ball tip probe reach up underneath the thecal sac and to a combination of pituitary rongeurs a ball-tipped probe I finished a very large free fragment that was densely adherent to the undersurface of the dura but centrally out from underneath the thecal sac upon removal of this large fragment thecal sac medially decompressed. Then I explored the right side felt a right S1 foramen felt the right S1 disc space and felt this did not need to be incised went back into the space and the left-sided further takedown clean out any remaining loose fragments within the disc space explored again both foramen with a coronary.and hockey-stick to confirm patency tested thecal sac began with a 4 Penfield to confirm that it was slack and no further fragments appreciate pass a probe out from underneath the thecal sac and the left side all where the right to confirm no fragments without being missed. Meticulous hemostasis was maintained Gelfoam was laid over the dura a medium Hemovac drain was placed and the wounds closed in layers with interrupted Vicryl and the skin was closed running 4 septic or benzoin and Steri-Strips were applied patient recovered in stable condition at the end of case on it counts sponge counts were correct.

## 2012-02-18 NOTE — ED Notes (Signed)
Complaining of numbness and inability to urinate/defecate. States numbness started Saturday. Inability to move bowel or urinate started Sunday. Denies pain at this time.

## 2012-02-18 NOTE — Anesthesia Preprocedure Evaluation (Addendum)
Anesthesia Evaluation  Patient identified by MRN, date of birth, ID band Patient awake    Reviewed: Allergy & Precautions, H&P , NPO status   Airway Mallampati: I TM Distance: >3 FB Neck ROM: Full    Dental  (+) Teeth Intact and Dental Advisory Given   Pulmonary  breath sounds clear to auscultation        Cardiovascular Rhythm:Regular Rate:Normal     Neuro/Psych    GI/Hepatic   Endo/Other    Renal/GU      Musculoskeletal   Abdominal   Peds  Hematology   Anesthesia Other Findings   Reproductive/Obstetrics                           Anesthesia Physical Anesthesia Plan  ASA: II  Anesthesia Plan: General   Post-op Pain Management:    Induction: Intravenous  Airway Management Planned: Oral ETT  Additional Equipment:   Intra-op Plan:   Post-operative Plan: Extubation in OR  Informed Consent: I have reviewed the patients History and Physical, chart, labs and discussed the procedure including the risks, benefits and alternatives for the proposed anesthesia with the patient or authorized representative who has indicated his/her understanding and acceptance.   Dental advisory given  Plan Discussed with: CRNA  Anesthesia Plan Comments:         Anesthesia Quick Evaluation

## 2012-02-19 ENCOUNTER — Encounter (HOSPITAL_COMMUNITY): Payer: Self-pay | Admitting: Neurosurgery

## 2012-02-19 MED ORDER — IBUPROFEN 800 MG PO TABS
800.0000 mg | ORAL_TABLET | Freq: Three times a day (TID) | ORAL | Status: DC | PRN
  Filled 2012-02-19: qty 1

## 2012-02-19 MED ORDER — ACETAMINOPHEN 650 MG RE SUPP: 650.0000 mg | RECTAL | Status: DC | PRN

## 2012-02-19 MED ORDER — PHENOL 1.4 % MT LIQD: 1.0000 | OROMUCOSAL | Status: DC | PRN

## 2012-02-19 MED ORDER — HYDROMORPHONE HCL PF 1 MG/ML IJ SOLN: 0.5000 mg | INTRAMUSCULAR | Status: DC | PRN

## 2012-02-19 MED ORDER — PREDNISONE 20 MG PO TABS
30.0000 mg | ORAL_TABLET | Freq: Every day | ORAL | Status: AC
  Administered 2012-02-19 – 2012-02-20 (×2): 30 mg via ORAL
  Filled 2012-02-19 (×2): qty 1

## 2012-02-19 MED ORDER — SODIUM CHLORIDE 0.9 % IJ SOLN
3.0000 mL | Freq: Two times a day (BID) | INTRAMUSCULAR | Status: DC
  Administered 2012-02-19 (×3): 3 mL via INTRAVENOUS

## 2012-02-19 MED ORDER — OXYCODONE-ACETAMINOPHEN 5-325 MG PO TABS: 1.0000 | ORAL_TABLET | ORAL | Status: DC | PRN

## 2012-02-19 MED ORDER — PREDNISONE 20 MG PO TABS
20.0000 mg | ORAL_TABLET | Freq: Every day | ORAL | Status: DC
  Filled 2012-02-19: qty 1

## 2012-02-19 MED ORDER — PREDNISONE 10 MG PO TABS: 10.0000 mg | ORAL_TABLET | Freq: Every day | ORAL | Status: DC

## 2012-02-19 MED ORDER — ACETAMINOPHEN 325 MG PO TABS: 650.0000 mg | ORAL_TABLET | ORAL | Status: DC | PRN

## 2012-02-19 MED ORDER — CYCLOBENZAPRINE HCL 10 MG PO TABS: 10.0000 mg | ORAL_TABLET | Freq: Three times a day (TID) | ORAL | Status: DC | PRN

## 2012-02-19 MED ORDER — HYDROCODONE-ACETAMINOPHEN 5-325 MG PO TABS
2.0000 | ORAL_TABLET | ORAL | Status: DC | PRN
  Administered 2012-02-19 – 2012-02-20 (×4): 2 via ORAL
  Filled 2012-02-19 (×4): qty 2

## 2012-02-19 MED ORDER — ONDANSETRON HCL 4 MG/2ML IJ SOLN: 4.0000 mg | INTRAMUSCULAR | Status: DC | PRN

## 2012-02-19 MED ORDER — MENTHOL 3 MG MT LOZG: 1.0000 | LOZENGE | OROMUCOSAL | Status: DC | PRN

## 2012-02-19 MED ORDER — CEFAZOLIN SODIUM 1-5 GM-% IV SOLN
1.0000 g | Freq: Three times a day (TID) | INTRAVENOUS | Status: AC
  Administered 2012-02-19 (×2): 1 g via INTRAVENOUS
  Filled 2012-02-19 (×2): qty 50

## 2012-02-19 NOTE — Progress Notes (Signed)
Subjective: Patient reports That is feeling a lot better he is having no leg pain the numbness is about the same  Objective: Vital signs in last 24 hours: Temp:  [98.2 F (36.8 C)-99.8 F (37.7 C)] 98.9 F (37.2 C) (08/27 0400) Pulse Rate:  [70-105] 95  (08/27 0400) Resp:  [11-20] 18  (08/27 0400) BP: (122-165)/(70-91) 122/70 mmHg (08/27 0400) SpO2:  [96 %-100 %] 97 % (08/27 0400)  Intake/Output from previous day: 08/26 0701 - 08/27 0700 In: 1100 [I.V.:1100] Out: 1470 [Urine:1325; Drains:45; Blood:100] Intake/Output this shift:    Status out of 5 except he has bilateral weakness in his EHL at one to 5 at baseline a little bit of dorsiflexion weakness but all that's baseline from preop  Lab Results:  Basename 02/18/12 1625  WBC 12.8*  HGB 15.7  HCT 46.0  PLT 244   BMET  Basename 02/18/12 1625  NA 137  K 4.4  CL 101  CO2 26  GLUCOSE 137*  BUN 14  CREATININE 0.83  CALCIUM 9.5    Studies/Results: Dg Lumbar Spine 2-3 Views  02/18/2012  *RADIOLOGY REPORT*  Clinical Data: Lumbar laminectomy  LUMBAR SPINE - 2-3 VIEW  Comparison: 02/18/2012 MRI  Findings: Initial image demonstrates a needle posterior to the L5 S1 level.  Subsequent image demonstrates hardware posterior to the S1 level.  L5 S1 degenerative disc disease.  IMPRESSION: Hardware localization as above.   Original Report Authenticated By: Waneta Martins, M.D.    Mr Lumbar Spine Wo Contrast  02/18/2012  *RADIOLOGY REPORT*  Clinical Data: Severe low back pain radiating into the right leg.  MRI LUMBAR SPINE WITHOUT CONTRAST  Technique:  Multiplanar and multiecho pulse sequences of the lumbar spine were obtained without intravenous contrast.  Comparison: 02/17/2012.  Findings: Vertebral body height is maintained.  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 Degenerative endplate signal change is noted at L5-S1.  The conus medullaris is normal in signal and position.  Imaged intra-  abdominal contents demonstrate distention of the urinary bladder.  The T11-12 and T12-L1 levels are imaged in the sagittal plane only. Minimal disc bulging is present at T11-12 but the central canal and foramina appear open at each level.  L1-2:  Negative.  L2-3:  There is some facet degenerative disease and ligamentum flavum thickening.  No disc bulge or protrusion.  Central canal and foramina are open.  L3-4:  There is facet arthropathy with a 0.7 cm in diameter synovial cyst off the posterior aspect of the left facet joint. Slight disc bulge is identified but no acquired central canal or foraminal stenosis is present.  L4-5:  The patient has a mild disc bulge with some facet arthropathy and ligamentum flavum thickening.  There is mild to moderate congenital acquired central canal and lateral recess narrowing with some encroachment on the descending L5 roots. Foramina are mildly narrowed.  L5-S1:  The patient has a very large central disc protrusion obliterating the thecal sac and causing severe narrowing of the lateral recesses.  Foramina are mildly narrowed.  IMPRESSION:  1.  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 2.  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   Original Report Authenticated By: Bernadene Bell. Maricela Curet, M.D.     Assessment/Plan:  progressi if he avoids we can discharge him later today.ve mobilization today  LOS: 1 day     Jerome Sutton P 02/19/2012, 7:59 AM

## 2012-02-19 NOTE — Plan of Care (Signed)
Problem: Consults Goal: Diagnosis - Spinal Surgery Outcome: Completed/Met Date Met:  02/19/12 Lumbar Laminectomy (Complex)

## 2012-02-19 NOTE — Progress Notes (Signed)
UR COMPLETED  

## 2012-02-20 LAB — URINE CULTURE: Colony Count: 5000

## 2012-02-20 NOTE — Progress Notes (Signed)
Pt given D/C instructions with verbal understanding. I taught Pt about his catheter, how to clean, empty, and change to leg bag when needed, Pt verbalized understanding of teaching. Pt D/C'd home with Foley catheter per MD order. Pt D/C'd home via wheelchair @ 1300 per MD order. Rema Fendt, RN

## 2012-02-20 NOTE — Progress Notes (Signed)
Subjective: Patient reports Is feeling pretty well no pain still has numbness in his feet and legs required a catheter replaced last night but discomfortable being discharged with catheter placed we'll arrange outpatient followup with urology  Objective: Vital signs in last 24 hours: Temp:  [98.3 F (36.8 C)-99.3 F (37.4 C)] 99 F (37.2 C) (08/28 0808) Pulse Rate:  [73-84] 82  (08/28 0808) Resp:  [16-18] 16  (08/28 0808) BP: (104-144)/(66-76) 132/72 mmHg (08/28 0808) SpO2:  [94 %-100 %] 95 % (08/28 0808)  Intake/Output from previous day: 08/27 0701 - 08/28 0700 In: 600 [P.O.:600] Out: 2260 [Urine:2250; Drains:10] Intake/Output this shift:    Neurologic stable still has weakness in bilateral EHL is but otherwise some slight weakness in dorsiflexion otherwise out of 5 wound clean and dry  Lab Results:  Basename 02/18/12 1625  WBC 12.8*  HGB 15.7  HCT 46.0  PLT 244   BMET  Basename 02/18/12 1625  NA 137  K 4.4  CL 101  CO2 26  GLUCOSE 137*  BUN 14  CREATININE 0.83  CALCIUM 9.5    Studies/Results: Dg Lumbar Spine 2-3 Views  02/18/2012  *RADIOLOGY REPORT*  Clinical Data: Lumbar laminectomy  LUMBAR SPINE - 2-3 VIEW  Comparison: 02/18/2012 MRI  Findings: Initial image demonstrates a needle posterior to the L5 S1 level.  Subsequent image demonstrates hardware posterior to the S1 level.  L5 S1 degenerative disc disease.  IMPRESSION: Hardware localization as above.   Original Report Authenticated By: Waneta Martins, M.D.    Mr Lumbar Spine Wo Contrast  02/18/2012  *RADIOLOGY REPORT*  Clinical Data: Severe low back pain radiating into the right leg.  MRI LUMBAR SPINE WITHOUT CONTRAST  Technique:  Multiplanar and multiecho pulse sequences of the lumbar spine were obtained without intravenous contrast.  Comparison: 02/17/2012.  Findings: Vertebral body height is maintained.  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 Degenerative endplate  signal change is noted at L5-S1.  The conus medullaris is normal in signal and position.  Imaged intra- abdominal contents demonstrate distention of the urinary bladder.  The T11-12 and T12-L1 levels are imaged in the sagittal plane only. Minimal disc bulging is present at T11-12 but the central canal and foramina appear open at each level.  L1-2:  Negative.  L2-3:  There is some facet degenerative disease and ligamentum flavum thickening.  No disc bulge or protrusion.  Central canal and foramina are open.  L3-4:  There is facet arthropathy with a 0.7 cm in diameter synovial cyst off the posterior aspect of the left facet joint. Slight disc bulge is identified but no acquired central canal or foraminal stenosis is present.  L4-5:  The patient has a mild disc bulge with some facet arthropathy and ligamentum flavum thickening.  There is mild to moderate congenital acquired central canal and lateral recess narrowing with some encroachment on the descending L5 roots. Foramina are mildly narrowed.  L5-S1:  The patient has a very large central disc protrusion obliterating the thecal sac and causing severe narrowing of the lateral recesses.  Foramina are mildly narrowed.  IMPRESSION:  1.  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 2.  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   Original Report Authenticated By: Bernadene Bell. Maricela Curet, M.D.     Assessment/Plan: Posterior day 2 from a decompressive  laminectomy discectomy for cauda equina syndrome patient's overall the recovering well still has significant bladder dysfunction will discharge catheter scheduled followup with urology dizziness pain medication home and finish out a steroid pack a  LOS: 2 days     Rickelle Sylvestre P 02/20/2012, 8:23 AM

## 2012-02-20 NOTE — Discharge Summary (Signed)
  Physician Discharge Summary  Patient ID: Jerome Sutton MRN: [id number] DOB/AGE: 12/21/1980 30 y.o.  Admit date: 02/18/2012 Discharge date: 02/20/2012  Admission Diagnoses: Cauda equina syndrome from large ruptured Disc L5-S1  Discharge Diagnoses: Same Active Problems:  * No active hospital problems. *    Discharged Condition: good  Hospital Course: Patient presented emergent apartment with difficulty urinating numbness and tingling in his perineum and weakness in his feet workup revealed a very large acute disc L5-S1 patient emergently to the or underwent laminectomy discectomy postoperatively are well had improvement postoperative pain and still had some bladder dysfunction I fully and be replaced and patient be discharged her scheduled followup with urology in approximately one week followup with me in 2 weeks.  Consults: Significant Diagnostic Studies: Treatments: Decompressive laminotomy L5-S1 and discectomy Discharge Exam: Blood pressure 132/72, pulse 82, temperature 99 F (37.2 C), temperature source Oral, resp. rate 16, SpO2 95.00%. Strength out of 5 proximally weakness in bilateral EHL is a mild weakness in dorsiflexion home  Disposition: Home   Medication List  As of 02/20/2012  8:26 AM   TAKE these medications         HYDROcodone-acetaminophen 5-500 MG per tablet   Commonly known as: VICODIN   Take 1-2 tablets by mouth every 6 (six) hours as needed. Depends on pain if he takes 1 or 2 tablets.      ibuprofen 800 MG tablet   Commonly known as: ADVIL,MOTRIN   Take 800 mg by mouth every 8 (eight) hours as needed. pain      predniSONE 20 MG tablet   Commonly known as: DELTASONE   Take 10-30 mg by mouth daily. Start 3 tablets once a day for 2 days, then 2 tablets once a day for 3 days, then 1 tablet once a day for 3 days. Started taking 02-17-12 for back inflammation           Follow-up Information    Follow up with Henri Baumler P, MD.   Contact information:   [address], [address] [place] [address] [phone]          Signed: Mariam Dollar 02/20/2012, 8:26 AM

## 2012-11-06 ENCOUNTER — Ambulatory Visit (HOSPITAL_COMMUNITY): Payer: PRIVATE HEALTH INSURANCE | Attending: Physical Therapy | Admitting: Physical Therapy

## 2014-07-08 ENCOUNTER — Emergency Department (HOSPITAL_COMMUNITY)
Admission: EM | Admit: 2014-07-08 | Discharge: 2014-07-08 | Disposition: A | Discharge status: Home / Self Care | Payer: No Typology Code available for payment source | Attending: Emergency Medicine | Admitting: Emergency Medicine

## 2014-07-08 ENCOUNTER — Encounter (HOSPITAL_COMMUNITY): Payer: Self-pay | Admitting: Emergency Medicine

## 2014-07-08 DIAGNOSIS — Y9289 Other specified places as the place of occurrence of the external cause: Secondary | ICD-10-CM | POA: Insufficient documentation

## 2014-07-08 DIAGNOSIS — Z791 Long term (current) use of non-steroidal anti-inflammatories (NSAID): Secondary | ICD-10-CM | POA: Insufficient documentation

## 2014-07-08 DIAGNOSIS — X58XXXA Exposure to other specified factors, initial encounter: Secondary | ICD-10-CM | POA: Insufficient documentation

## 2014-07-08 DIAGNOSIS — Y9301 Activity, walking, marching and hiking: Secondary | ICD-10-CM | POA: Insufficient documentation

## 2014-07-08 DIAGNOSIS — Y998 Other external cause status: Secondary | ICD-10-CM | POA: Insufficient documentation

## 2014-07-08 DIAGNOSIS — S76911A Strain of unspecified muscles, fascia and tendons at thigh level, right thigh, initial encounter: Secondary | ICD-10-CM | POA: Insufficient documentation

## 2014-07-08 MED ORDER — NAPROXEN 500 MG PO TABS: 500.0000 mg | ORAL_TABLET | Freq: Two times a day (BID) | ORAL | Status: DC

## 2014-07-08 MED ORDER — CYCLOBENZAPRINE HCL 10 MG PO TABS: ORAL_TABLET | ORAL | Status: DC

## 2014-07-08 NOTE — ED Provider Notes (Signed)
CSN: [id number]     Arrival date & time 07/08/14  [id number] History   First MD Initiated Contact with Patient 07/08/14 0800     Chief Complaint  Patient presents with  . Leg Pain     (Consider location/radiation/quality/duration/timing/severity/associated sxs/prior Treatment) Patient is a 34 y.o. male presenting with leg pain. The history is provided by the patient.  Leg Pain Location:  Leg Time since incident:  9 days Injury: no   Leg location:  R upper leg Pain details:    Quality:  Aching   Radiates to:  Does not radiate   Onset quality:  Gradual   Timing:  Constant   Progression:  Worsening Chronicity:  New Dislocation: no   Foreign body present:  No foreign bodies Relieved by:  NSAIDs Worsened by:  Activity and bearing weight  Jerome Sutton is a 34 y.o. male who presents to the ED with right upper leg pain that started over a week ago. He states that he does a lot of walking on his job and goes up and down steps. He takes Advil and the pain goes away but then after going up and down steps the pain returns. He does not remember any specific injury and has not injured that leg in the past.  He denies any other problems today. He has had surgery on his lower back. The pain is located in the left lateral thigh and radiates to the left hip. There is no pain at this time.   History reviewed. No pertinent past medical history. Past Surgical History  Procedure Laterality Date  . Lumbar laminectomy/decompression microdiscectomy  02/18/2012    Procedure: LUMBAR LAMINECTOMY/DECOMPRESSION MICRODISCECTOMY 1 LEVEL;  Surgeon: Mariam DollarGary P Cram, MD;  Location: [facility];  Service: Neurosurgery;  Laterality: Bilateral;  Bilateral Lumbar Laminectomy and diskectomy   History reviewed. No pertinent family history. History  Substance Use Topics  . Smoking status: Never Smoker   . Smokeless tobacco: Not on file  . Alcohol Use: No    Review of Systems Negative except as stated in  HPI   Allergies  Review of patient's allergies indicates no known allergies.  Home Medications   Prior to Admission medications   Medication Sig Start Date End Date Taking? Authorizing Provider  cyclobenzaprine (FLEXERIL) 10 MG tablet Take one tablet at bedtime as needed for muscle spasm 07/08/14   Janne NapoleonHope M Raidyn Breiner, NP  ibuprofen (ADVIL,MOTRIN) 800 MG tablet Take 800 mg by mouth every 8 (eight) hours as needed. pain    Historical Provider, MD  naproxen (NAPROSYN) 500 MG tablet Take 1 tablet (500 mg total) by mouth 2 (two) times daily. 07/08/14   Vera Furniss Orlene OchM Natiya Seelinger, NP   BP 153/99 mmHg  Pulse 101  Temp(Src) 98.1 F (36.7 C) (Oral)  Resp 18  Ht 5\' 5"  (1.651 m)  Wt 260 lb (117.935 kg)  BMI 43.27 kg/m2  SpO2 100% Physical Exam  Constitutional: He is oriented to person, place, and time. He appears well-developed and well-nourished. No distress.  HENT:  Head: Normocephalic.  Eyes: EOM are normal.  Neck: Neck supple.  Cardiovascular: Normal rate.   Pulmonary/Chest: Effort normal.  Abdominal: Soft. Bowel sounds are normal. There is no tenderness.  Musculoskeletal: Normal range of motion.       Right hip: He exhibits normal range of motion, normal strength, no tenderness, no swelling, no crepitus, no deformity and no laceration.  Unable to reproduce any pain that the patient has had. He states that the pain  comes sometimes when he stands and then goes away after he walks for a while. Pedal pulses equal, adequate circulation, good touch sensation. Full range of motion of the hip and knee without pain.   Neurological: He is alert and oriented to person, place, and time. No cranial nerve deficit.  Skin: Skin is warm and dry.  Psychiatric: He has a normal mood and affect. His behavior is normal.  Nursing note and vitals reviewed.   ED Course  Procedures  MDM  34 y.o. male with hx of left lateral thigh pain that radiates to the left hip. Pain if relieved with Advil. No pain during exam. Stable for  d/c without neurovascular deficits and without pain. Will start NSAIDS and muscle relaxant as needed.  He will follow up with his PCP if the pain returns.  Work note given for today.  Final diagnoses:  Muscle strain of right thigh, initial encounter       [facility], NP 07/08/14 1205  Benny Lennert, MD 07/08/14 1415

## 2014-07-08 NOTE — ED Notes (Signed)
Patient with no complaints at this time. Respirations even and unlabored. Skin warm/dry. Discharge instructions reviewed with patient at this time. Patient given opportunity to voice concerns/ask questions. Patient discharged at this time and left Emergency Department with steady gait.   

## 2014-07-08 NOTE — ED Notes (Signed)
Pt reports right lower leg pain x1.5 weeks. nad noted. Pt able to ambulate with steady gait and bear weight to RLE.

## 2017-07-03 ENCOUNTER — Inpatient Hospital Stay (HOSPITAL_COMMUNITY)
Admission: EM | Admit: 2017-07-03 | Discharge: 2017-07-06 | DRG: 504 | Disposition: A | Discharge status: Home Health | Payer: BLUE CROSS/BLUE SHIELD | Attending: Orthopedic Surgery | Admitting: Orthopedic Surgery

## 2017-07-03 ENCOUNTER — Emergency Department (HOSPITAL_COMMUNITY): Payer: BLUE CROSS/BLUE SHIELD

## 2017-07-03 ENCOUNTER — Encounter (HOSPITAL_COMMUNITY): Payer: Self-pay | Admitting: Emergency Medicine

## 2017-07-03 ENCOUNTER — Other Ambulatory Visit: Payer: Self-pay

## 2017-07-03 DIAGNOSIS — L02619 Cutaneous abscess of unspecified foot: Secondary | ICD-10-CM | POA: Diagnosis present

## 2017-07-03 DIAGNOSIS — M868X7 Other osteomyelitis, ankle and foot: Principal | ICD-10-CM | POA: Diagnosis present

## 2017-07-03 DIAGNOSIS — M869 Osteomyelitis, unspecified: Secondary | ICD-10-CM | POA: Diagnosis present

## 2017-07-03 DIAGNOSIS — L02612 Cutaneous abscess of left foot: Secondary | ICD-10-CM | POA: Diagnosis present

## 2017-07-03 DIAGNOSIS — M86272 Subacute osteomyelitis, left ankle and foot: Secondary | ICD-10-CM | POA: Diagnosis not present

## 2017-07-03 DIAGNOSIS — M009 Pyogenic arthritis, unspecified: Secondary | ICD-10-CM | POA: Diagnosis present

## 2017-07-03 LAB — SEDIMENTATION RATE: Sed Rate: 44 mm/hr — ABNORMAL HIGH (ref 0–16)

## 2017-07-03 LAB — BASIC METABOLIC PANEL
ANION GAP: 12 (ref 5–15)
BUN: 9 mg/dL (ref 6–20)
CHLORIDE: 103 mmol/L (ref 101–111)
CO2: 27 mmol/L (ref 22–32)
Calcium: 9.4 mg/dL (ref 8.9–10.3)
Creatinine, Ser: 0.87 mg/dL (ref 0.61–1.24)
GFR calc Af Amer: >60 mL/min (ref >60)
GLUCOSE: 120 mg/dL — AB (ref 65–99)
POTASSIUM: 3.8 mmol/L (ref 3.5–5.1)
Sodium: 142 mmol/L (ref 135–145)

## 2017-07-03 LAB — CBC WITH DIFFERENTIAL/PLATELET
Basophils Absolute: 0 10*3/uL (ref 0.0–0.1)
Basophils Relative: 0 %
Eosinophils Absolute: 0 10*3/uL (ref 0.0–0.7)
Eosinophils Relative: 0 %
HEMATOCRIT: 43.5 % (ref 39.0–52.0)
Hemoglobin: 13.9 g/dL (ref 13.0–17.0)
LYMPHS ABS: 0.9 10*3/uL (ref 0.7–4.0)
LYMPHS PCT: 8 %
MCH: 29 pg (ref 26.0–34.0)
MCHC: 32 g/dL (ref 30.0–36.0)
MCV: 90.6 fL (ref 78.0–100.0)
MONO ABS: 2.1 10*3/uL — AB (ref 0.1–1.0)
Monocytes Relative: 18 %
NEUTROS ABS: 8.6 10*3/uL — AB (ref 1.7–7.7)
Neutrophils Relative %: 74 %
Platelets: 234 10*3/uL (ref 150–400)
RBC: 4.8 MIL/uL (ref 4.22–5.81)
RDW: 12.5 % (ref 11.5–15.5)
WBC: 11.6 10*3/uL — ABNORMAL HIGH (ref 4.0–10.5)

## 2017-07-03 LAB — URIC ACID: Uric Acid, Serum: 5 mg/dL (ref 4.4–7.6)

## 2017-07-03 MED ORDER — ACETAMINOPHEN 325 MG PO TABS: 325.0000 mg | ORAL_TABLET | ORAL | Status: DC | PRN

## 2017-07-03 MED ORDER — HYDROCODONE-ACETAMINOPHEN 5-325 MG PO TABS
1.0000 | ORAL_TABLET | ORAL | Status: DC | PRN
  Administered 2017-07-04 – 2017-07-06 (×5): 1 via ORAL
  Filled 2017-07-03 (×6): qty 1

## 2017-07-03 MED ORDER — ALUM & MAG HYDROXIDE-SIMETH 200-200-20 MG/5ML PO SUSP: 30.0000 mL | ORAL | Status: DC | PRN

## 2017-07-03 MED ORDER — VANCOMYCIN HCL IN DEXTROSE 1-5 GM/200ML-% IV SOLN
1000.0000 mg | Freq: Once | INTRAVENOUS | Status: AC
  Administered 2017-07-03: 1000 mg via INTRAVENOUS
  Filled 2017-07-03: qty 200

## 2017-07-03 MED ORDER — SODIUM CHLORIDE 0.9 % IV SOLN
INTRAVENOUS | Status: DC
  Administered 2017-07-03: 22:00:00 via INTRAVENOUS

## 2017-07-03 MED ORDER — ONDANSETRON HCL 4 MG/2ML IJ SOLN: 4.0000 mg | Freq: Four times a day (QID) | INTRAMUSCULAR | Status: DC | PRN

## 2017-07-03 MED ORDER — MORPHINE SULFATE (PF) 2 MG/ML IV SOLN: 2.0000 mg | INTRAVENOUS | Status: DC | PRN

## 2017-07-03 NOTE — ED Triage Notes (Signed)
Pt left foot started going numb, had back surgery for it. States numb intermittent but here today due to swelling to left foot also. States has had the swelling in past once before but did not seek treatment.

## 2017-07-03 NOTE — ED Provider Notes (Signed)
[facility] EMERGENCY DEPARTMENT Provider Note   CSN: [id number] Arrival date & time: 07/03/17  1800     History   Chief Complaint Chief Complaint  Patient presents with  . Foot Pain    HPI Jerome Sutton is a 37 y.o. male with no significant past medical history except from a lumbar laminectomy secondary to cauda equina syndrome 5 years ago with persistent left lateral foot numbness since that event presenting with left foot pain and swelling and erythema.  He endorses intermittent episodes of similar symptoms including pain and swelling since his surgery at his distal left foot , most recently about 6 months ago, then again starting this week with increased pain localizing to his fourth and fifth toes and distal foot.  He denies injury to the foot.  He has had no fevers or chills, denies radiation of pain into his leg.  He has had no treatment prior to arrival.  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  He has found no alleviators for symptoms.  The history is provided by the patient.    History reviewed. No pertinent past medical history.  Patient Active Problem List   Diagnosis Date Noted  . Abscess of foot 07/03/2017    Past Surgical History:  Procedure Laterality Date  . BACK SURGERY    . LUMBAR LAMINECTOMY/DECOMPRESSION MICRODISCECTOMY  02/18/2012   Procedure: LUMBAR LAMINECTOMY/DECOMPRESSION MICRODISCECTOMY 1 LEVEL;  Surgeon: Mariam Dollar, MD;  Location: [facility];  Service: Neurosurgery;  Laterality: Bilateral;  Bilateral Lumbar Laminectomy and diskectomy       Home Medications    Prior to Admission medications   Not on File    Family History History reviewed. No pertinent family history.  Social History Social History   Tobacco Use  . Smoking status: Never Smoker  . Smokeless tobacco: Never Used  Substance Use Topics  . Alcohol use: No  . Drug use: No     Allergies   Patient has no known allergies.   Review of  Systems Review of Systems  Constitutional: Negative for fever.  Musculoskeletal: Positive for arthralgias and joint swelling. Negative for myalgias.  Skin: Positive for color change.  Neurological: Negative for weakness and numbness.     Physical Exam Updated Vital Signs BP (!) 150/85 (BP Location: Right Arm)   Pulse 96   Temp 99.8 F (37.7 C) (Oral)   Resp 18   Ht 5\' 8"  (1.727 m)   Wt 95.3 kg (210 lb)   SpO2 96%   BMI 31.93 kg/m   Physical Exam  Constitutional: He appears well-developed and well-nourished.  HENT:  Head: Atraumatic.  Neck: Normal range of motion.  Cardiovascular:  Pulses equal bilaterally  Musculoskeletal: He exhibits edema, tenderness and deformity.       Left foot: There is bony tenderness, swelling and deformity.  Moderate edema of left foot with dorsal erythema and edema most intense at the base of the 4th and fifth toes. Scaling between toes with some mild maceration suggesting tinea pedis. Toes are held in dorsiflexion at rest, suspect this is  result of generalized foot edema.  Hard raised callous like lesion plant foot at 4th mtp head with a deep groove in the skin from the lesion to the intertriginous space of the 4th and 5th toes. No drainage, no obvious open wounds.  Neurological: He is alert. He has normal strength. He displays normal reflexes. No sensory deficit.  Skin: Skin is warm  and dry.  Psychiatric: He has a normal mood and affect.     ED Treatments / Results  Labs (all labs ordered are listed, but only abnormal results are displayed) Labs Reviewed  BASIC METABOLIC PANEL - Abnormal; Notable for the following components:      Result Value   Glucose, Bld 120 (*)    All other components within normal limits  CBC WITH DIFFERENTIAL/PLATELET - Abnormal; Notable for the following components:   WBC 11.6 (*)    Neutro Abs 8.6 (*)    Monocytes Absolute 2.1 (*)    All other components within normal limits  URIC ACID  SEDIMENTATION RATE    C-REACTIVE PROTEIN  BASIC METABOLIC PANEL  CBC WITH DIFFERENTIAL/PLATELET    EKG  EKG Interpretation None       Radiology Dg Foot Complete Left  Result Date: 07/03/2017 CLINICAL DATA:  LEFT foot pain and swelling, no known injury EXAM: LEFT FOOT - COMPLETE 3+ VIEW COMPARISON:  None FINDINGS: Diffuse osseous demineralization. Bone destruction at fourth MTP joint with small focus of soft tissue gas, highly suspicious for osteomyelitis and septic arthritis. Slight splaying of the interval between the fourth and fifth MTP joints question edema. Remaining joint spaces preserved. No acute fracture, dislocation or additional bone destruction. Significant soft tissue swelling of forefoot. IMPRESSION: Bone destruction on both sides of the LEFT fourth MTP joint with small focus of soft tissue gas highly suspicious for septic arthritis and osteomyelitis. Findings called to Burgess AmorJulie Danasha Melman PA on 07/03/2017 at 1934 hours. Electronically Signed   By: Ulyses SouthwardMark  Boles M.D.   On: 07/03/2017 19:35    Procedures Procedures (including critical care time)  Medications Ordered in ED Medications  vancomycin (VANCOCIN) IVPB 1000 mg/200 mL premix (1,000 mg Intravenous New Bag/Given 07/03/17 2132)  0.9 %  sodium chloride infusion (not administered)  morphine 2 MG/ML injection 2 mg (not administered)  HYDROcodone-acetaminophen (NORCO/VICODIN) 5-325 MG per tablet 1 tablet (not administered)  ondansetron (ZOFRAN) injection 4 mg (not administered)  alum & mag hydroxide-simeth (MAALOX/MYLANTA) 200-200-20 MG/5ML suspension 30 mL (not administered)  acetaminophen (TYLENOL) tablet 325 mg (not administered)     Initial Impression / Assessment and Plan / ED Course  I have reviewed the triage vital signs and the nursing notes.  Pertinent labs & imaging results that were available during my care of the patient were reviewed by me and considered in my medical decision making (see chart for details).     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  Discussed with Dr. Romeo AppleHarrison who requested c reactive protein and sed rate, vancomycin. Will admit for further management.   Final Clinical Impressions(s) / ED Diagnoses   Final diagnoses:  Osteomyelitis of left foot, unspecified type [facility](HCC)    ED Discharge Orders    None       Victoriano Laindol, Darik Massing, PA-C 07/03/17 2224    Bethann BerkshireZammit, Joseph, MD 07/04/17 1555

## 2017-07-04 ENCOUNTER — Encounter (HOSPITAL_COMMUNITY): Payer: Self-pay | Admitting: Orthopedic Surgery

## 2017-07-04 ENCOUNTER — Inpatient Hospital Stay (HOSPITAL_COMMUNITY): Payer: BLUE CROSS/BLUE SHIELD

## 2017-07-04 ENCOUNTER — Inpatient Hospital Stay (HOSPITAL_COMMUNITY): Payer: BLUE CROSS/BLUE SHIELD | Admitting: Anesthesiology

## 2017-07-04 ENCOUNTER — Encounter (HOSPITAL_COMMUNITY)
Admission: EM | Disposition: A | Discharge status: Home Health | Payer: Self-pay | Source: Home / Self Care | Attending: Orthopedic Surgery

## 2017-07-04 DIAGNOSIS — L02619 Cutaneous abscess of unspecified foot: Secondary | ICD-10-CM

## 2017-07-04 DIAGNOSIS — M869 Osteomyelitis, unspecified: Secondary | ICD-10-CM

## 2017-07-04 DIAGNOSIS — M86272 Subacute osteomyelitis, left ankle and foot: Secondary | ICD-10-CM

## 2017-07-04 HISTORY — PX: INCISION AND DRAINAGE ABSCESS: SHX5864

## 2017-07-04 LAB — CBC WITH DIFFERENTIAL/PLATELET
Basophils Absolute: 0 10*3/uL (ref 0.0–0.1)
Basophils Relative: 0 %
Eosinophils Absolute: 0 10*3/uL (ref 0.0–0.7)
Eosinophils Relative: 0 %
HCT: 43.7 % (ref 39.0–52.0)
HEMOGLOBIN: 13.4 g/dL (ref 13.0–17.0)
LYMPHS ABS: 1.4 10*3/uL (ref 0.7–4.0)
LYMPHS PCT: 14 %
MCH: 27.9 pg (ref 26.0–34.0)
MCHC: 30.7 g/dL (ref 30.0–36.0)
MCV: 90.9 fL (ref 78.0–100.0)
Monocytes Absolute: 1.8 10*3/uL — ABNORMAL HIGH (ref 0.1–1.0)
Monocytes Relative: 18 %
NEUTROS ABS: 7.1 10*3/uL (ref 1.7–7.7)
Neutrophils Relative %: 68 %
Platelets: 241 10*3/uL (ref 150–400)
RBC: 4.81 MIL/uL (ref 4.22–5.81)
RDW: 12.7 % (ref 11.5–15.5)
WBC: 10.4 10*3/uL (ref 4.0–10.5)

## 2017-07-04 LAB — BASIC METABOLIC PANEL
ANION GAP: 11 (ref 5–15)
BUN: 9 mg/dL (ref 6–20)
CHLORIDE: 101 mmol/L (ref 101–111)
CO2: 27 mmol/L (ref 22–32)
Calcium: 9.1 mg/dL (ref 8.9–10.3)
Creatinine, Ser: 0.89 mg/dL (ref 0.61–1.24)
GFR calc non Af Amer: >60 mL/min (ref >60)
Glucose, Bld: 107 mg/dL — ABNORMAL HIGH (ref 65–99)
POTASSIUM: 4.2 mmol/L (ref 3.5–5.1)
SODIUM: 139 mmol/L (ref 135–145)

## 2017-07-04 LAB — C-REACTIVE PROTEIN: CRP: 10.3 mg/dL — ABNORMAL HIGH (ref <1.0)

## 2017-07-04 SURGERY — INCISION AND DRAINAGE, ABSCESS
Anesthesia: General | Laterality: Left

## 2017-07-04 MED ORDER — SODIUM CHLORIDE 0.9 % IV SOLN
INTRAVENOUS | Status: DC
  Administered 2017-07-04: 16:00:00 via INTRAVENOUS

## 2017-07-04 MED ORDER — MIDAZOLAM HCL 2 MG/2ML IJ SOLN
1.0000 mg | INTRAMUSCULAR | Status: DC
  Administered 2017-07-04: 2 mg via INTRAVENOUS

## 2017-07-04 MED ORDER — PROPOFOL 10 MG/ML IV BOLUS
INTRAVENOUS | Status: DC | PRN
  Administered 2017-07-04: 160 mg via INTRAVENOUS
  Administered 2017-07-04: 50 mg via INTRAVENOUS
  Administered 2017-07-04: 40 mg via INTRAVENOUS

## 2017-07-04 MED ORDER — SODIUM CHLORIDE 0.9 % IR SOLN
Status: DC | PRN
  Administered 2017-07-04 (×2): 1000 mL

## 2017-07-04 MED ORDER — BISACODYL 10 MG RE SUPP: 10.0000 mg | Freq: Every day | RECTAL | Status: DC | PRN

## 2017-07-04 MED ORDER — OXYCODONE HCL 5 MG PO TABS
5.0000 mg | ORAL_TABLET | ORAL | Status: DC | PRN
  Administered 2017-07-05: 5 mg via ORAL
  Filled 2017-07-04: qty 1

## 2017-07-04 MED ORDER — FENTANYL CITRATE (PF) 100 MCG/2ML IJ SOLN
25.0000 ug | INTRAMUSCULAR | Status: DC | PRN
  Administered 2017-07-04: 50 ug via INTRAVENOUS
  Administered 2017-07-04: 25 ug via INTRAVENOUS

## 2017-07-04 MED ORDER — METOCLOPRAMIDE HCL 10 MG PO TABS: 5.0000 mg | ORAL_TABLET | Freq: Three times a day (TID) | ORAL | Status: DC | PRN

## 2017-07-04 MED ORDER — MIDAZOLAM HCL 2 MG/2ML IJ SOLN
INTRAMUSCULAR | Status: AC
  Filled 2017-07-04: qty 2

## 2017-07-04 MED ORDER — LIDOCAINE HCL (CARDIAC) 10 MG/ML IV SOLN
INTRAVENOUS | Status: DC | PRN
  Administered 2017-07-04: 40 mg via INTRAVENOUS

## 2017-07-04 MED ORDER — POVIDONE-IODINE 10 % EX SWAB: 2.0000 "application " | Freq: Once | CUTANEOUS | Status: DC

## 2017-07-04 MED ORDER — CHLORHEXIDINE GLUCONATE 4 % EX LIQD: 60.0000 mL | Freq: Once | CUTANEOUS | Status: DC

## 2017-07-04 MED ORDER — PHENOL 1.4 % MT LIQD: 1.0000 | OROMUCOSAL | Status: DC | PRN

## 2017-07-04 MED ORDER — FENTANYL CITRATE (PF) 100 MCG/2ML IJ SOLN
25.0000 ug | INTRAMUSCULAR | Status: DC | PRN
  Filled 2017-07-04: qty 2

## 2017-07-04 MED ORDER — MENTHOL 3 MG MT LOZG: 1.0000 | LOZENGE | OROMUCOSAL | Status: DC | PRN

## 2017-07-04 MED ORDER — ONDANSETRON HCL 4 MG PO TABS: 4.0000 mg | ORAL_TABLET | Freq: Four times a day (QID) | ORAL | Status: DC | PRN

## 2017-07-04 MED ORDER — VANCOMYCIN HCL 10 G IV SOLR
1250.0000 mg | Freq: Once | INTRAVENOUS | Status: AC
  Administered 2017-07-04: 1250 mg via INTRAVENOUS
  Filled 2017-07-04: qty 1250

## 2017-07-04 MED ORDER — ACETAMINOPHEN 325 MG PO TABS: 650.0000 mg | ORAL_TABLET | Freq: Four times a day (QID) | ORAL | Status: DC | PRN

## 2017-07-04 MED ORDER — ACETAMINOPHEN 650 MG RE SUPP: 650.0000 mg | Freq: Four times a day (QID) | RECTAL | Status: DC | PRN

## 2017-07-04 MED ORDER — GADOBENATE DIMEGLUMINE 529 MG/ML IV SOLN
20.0000 mL | Freq: Once | INTRAVENOUS | Status: AC | PRN
  Administered 2017-07-04: 20 mL via INTRAVENOUS

## 2017-07-04 MED ORDER — MAGNESIUM HYDROXIDE 400 MG/5ML PO SUSP
30.0000 mL | Freq: Every day | ORAL | Status: DC | PRN
Start: 2017-07-04 — End: 2017-07-06

## 2017-07-04 MED ORDER — DOCUSATE SODIUM 100 MG PO CAPS
100.0000 mg | ORAL_CAPSULE | Freq: Two times a day (BID) | ORAL | Status: DC
  Administered 2017-07-04 – 2017-07-06 (×2): 100 mg via ORAL
  Filled 2017-07-04 (×3): qty 1

## 2017-07-04 MED ORDER — FENTANYL CITRATE (PF) 100 MCG/2ML IJ SOLN
INTRAMUSCULAR | Status: DC | PRN
  Administered 2017-07-04: 50 ug via INTRAVENOUS
  Administered 2017-07-04 (×2): 25 ug via INTRAVENOUS
  Administered 2017-07-04 (×2): 50 ug via INTRAVENOUS
  Administered 2017-07-04 (×2): 25 ug via INTRAVENOUS

## 2017-07-04 MED ORDER — FENTANYL CITRATE (PF) 250 MCG/5ML IJ SOLN
INTRAMUSCULAR | Status: AC
  Filled 2017-07-04: qty 5

## 2017-07-04 MED ORDER — ALUM & MAG HYDROXIDE-SIMETH 200-200-20 MG/5ML PO SUSP
30.0000 mL | ORAL | Status: DC | PRN
Start: 2017-07-04 — End: 2017-07-06

## 2017-07-04 MED ORDER — PROPOFOL 10 MG/ML IV BOLUS
INTRAVENOUS | Status: AC
  Filled 2017-07-04: qty 20

## 2017-07-04 MED ORDER — FLEET ENEMA 7-19 GM/118ML RE ENEM: 1.0000 | ENEMA | Freq: Once | RECTAL | Status: DC | PRN

## 2017-07-04 MED ORDER — VANCOMYCIN HCL IN DEXTROSE 1-5 GM/200ML-% IV SOLN
1000.0000 mg | Freq: Two times a day (BID) | INTRAVENOUS | Status: DC
  Administered 2017-07-04 – 2017-07-05 (×2): 1000 mg via INTRAVENOUS
  Filled 2017-07-04 (×2): qty 200

## 2017-07-04 MED ORDER — HEPARIN SODIUM (PORCINE) 5000 UNIT/ML IJ SOLN
5000.0000 [IU] | Freq: Three times a day (TID) | INTRAMUSCULAR | Status: AC
  Administered 2017-07-04 (×2): 5000 [IU] via SUBCUTANEOUS
  Filled 2017-07-04 (×2): qty 1

## 2017-07-04 MED ORDER — FENTANYL CITRATE (PF) 100 MCG/2ML IJ SOLN
25.0000 ug | Freq: Once | INTRAMUSCULAR | Status: AC
  Administered 2017-07-04: 25 ug via INTRAVENOUS

## 2017-07-04 MED ORDER — ONDANSETRON HCL 4 MG/2ML IJ SOLN: 4.0000 mg | Freq: Four times a day (QID) | INTRAMUSCULAR | Status: DC | PRN

## 2017-07-04 MED ORDER — FENTANYL CITRATE (PF) 100 MCG/2ML IJ SOLN
INTRAMUSCULAR | Status: AC
  Filled 2017-07-04: qty 2

## 2017-07-04 MED ORDER — LACTATED RINGERS IV SOLN
INTRAVENOUS | Status: DC
  Administered 2017-07-04: 1000 mL via INTRAVENOUS

## 2017-07-04 MED ORDER — METOCLOPRAMIDE HCL 5 MG/ML IJ SOLN: 5.0000 mg | Freq: Three times a day (TID) | INTRAMUSCULAR | Status: DC | PRN

## 2017-07-04 SURGICAL SUPPLY — 33 items
BAG HAMPER (MISCELLANEOUS) ×3 IMPLANT
BANDAGE ELASTIC 4 LF NS (GAUZE/BANDAGES/DRESSINGS) ×3 IMPLANT
BANDAGE ESMARK 4X12 BL STRL LF (DISPOSABLE) ×1 IMPLANT
BLADE AVERAGE 25MMX9MM (BLADE) ×1
BLADE AVERAGE 25X9 (BLADE) ×1 IMPLANT
BNDG CMPR 12X4 ELC STRL LF (DISPOSABLE) ×1
BNDG CMPR MED 5X4 ELC HKLP NS (GAUZE/BANDAGES/DRESSINGS) ×1
BNDG CONFORM 2 STRL LF (GAUZE/BANDAGES/DRESSINGS) ×3 IMPLANT
BNDG ESMARK 4X12 BLUE STRL LF (DISPOSABLE) ×3
BNDG GAUZE ELAST 4 BULKY (GAUZE/BANDAGES/DRESSINGS) ×4 IMPLANT
CLOTH BEACON ORANGE TIMEOUT ST (SAFETY) ×3 IMPLANT
COVER LIGHT HANDLE STERIS (MISCELLANEOUS) ×6 IMPLANT
ELECT REM PT RETURN 9FT ADLT (ELECTROSURGICAL) ×3
ELECTRODE REM PT RTRN 9FT ADLT (ELECTROSURGICAL) ×1 IMPLANT
GAUZE IODOFORM PACK 1/2 7832 (GAUZE/BANDAGES/DRESSINGS) ×2 IMPLANT
GAUZE SPONGE 4X4 12PLY STRL (GAUZE/BANDAGES/DRESSINGS) ×2 IMPLANT
GLOVE BIOGEL PI IND STRL 7.0 (GLOVE) ×1 IMPLANT
GLOVE BIOGEL PI INDICATOR 7.0 (GLOVE) ×8
GLOVE OPTIFIT SS 8.0 STRL (GLOVE) ×3 IMPLANT
GLOVE SKINSENSE NS SZ8.0 LF (GLOVE) ×2
GLOVE SKINSENSE STRL SZ8.0 LF (GLOVE) ×1 IMPLANT
GOWN STRL REUS W/TWL LRG LVL3 (GOWN DISPOSABLE) ×5 IMPLANT
GOWN STRL REUS W/TWL XL LVL3 (GOWN DISPOSABLE) ×3 IMPLANT
INST SET MINOR BONE (KITS) ×3 IMPLANT
KIT ROOM TURNOVER APOR (KITS) ×3 IMPLANT
MANIFOLD NEPTUNE II (INSTRUMENTS) ×3 IMPLANT
MARKER SKIN DUAL TIP RULER LAB (MISCELLANEOUS) ×3 IMPLANT
NS IRRIG 1000ML POUR BTL (IV SOLUTION) ×6 IMPLANT
PACK BASIC LIMB (CUSTOM PROCEDURE TRAY) ×2 IMPLANT
PAD ABD 5X9 TENDERSORB (GAUZE/BANDAGES/DRESSINGS) ×4 IMPLANT
PAD ARMBOARD 7.5X6 YLW CONV (MISCELLANEOUS) ×3 IMPLANT
SET BASIN LINEN APH (SET/KITS/TRAYS/PACK) ×3 IMPLANT
SYR BULB IRRIGATION 50ML (SYRINGE) ×3 IMPLANT

## 2017-07-04 NOTE — Anesthesia Preprocedure Evaluation (Addendum)
Anesthesia Evaluation  Patient identified by MRN, date of birth, ID band Patient awake    Reviewed: Allergy & Precautions, NPO status , Patient's Chart, lab work & pertinent test results  Airway Mallampati: II  TM Distance: >3 FB Neck ROM: Full    Dental  (+) Teeth Intact   Pulmonary neg pulmonary ROS,    breath sounds clear to auscultation       Cardiovascular negative cardio ROS   Rhythm:Regular Rate:Normal     Neuro/Psych negative neurological ROS  negative psych ROS   GI/Hepatic negative GI ROS, Neg liver ROS,   Endo/Other  negative endocrine ROS  Renal/GU negative Renal ROS     Musculoskeletal abcess and osteomyelitis left foot    Abdominal   Peds  Hematology negative hematology ROS (+)   Anesthesia Other Findings   Reproductive/Obstetrics                            Anesthesia Physical Anesthesia Plan  ASA: I  Anesthesia Plan: General   Post-op Pain Management:    Induction: Intravenous  PONV Risk Score and Plan:   Airway Management Planned: LMA  Additional Equipment:   Intra-op Plan:   Post-operative Plan: Extubation in OR  Informed Consent: I have reviewed the patients History and Physical, chart, labs and discussed the procedure including the risks, benefits and alternatives for the proposed anesthesia with the patient or authorized representative who has indicated his/her understanding and acceptance.     Plan Discussed with:   Anesthesia Plan Comments:         Anesthesia Quick Evaluation

## 2017-07-04 NOTE — H&P (View-Only) (Signed)
Pharmacy Antibiotic Note  Jerome Sutton is a 36 y.o. male admitted on 07/03/2017 with osteomyelitis.  Pharmacy has been consulted for VANCOMYCIN dosing.  Pt received Vancomycin 1000mg x 1 last pm  Plan: Vancomycin 1250mg x 1 today then 1000mg IV q12hrs Check trough at steady state Monitor labs, progress, c/s  Height: 5' 8" (172.7 cm) Weight: 204 lb 1.6 oz (92.6 kg) IBW/kg (Calculated) : 68.4  Temp (24hrs), Avg:98.9 F (37.2 C), Min:98.2 F (36.8 C), Max:99.8 F (37.7 C)  Recent Labs  Lab 07/03/17 1947 07/04/17 0418  WBC 11.6* 10.4  CREATININE 0.87 0.89    Estimated Creatinine Clearance: 128 mL/min (by C-G formula based on SCr of 0.89 mg/dL).    No Known Allergies  Antimicrobials this admission: Vancomycin 1/9 >>   Dose adjustments this admission:  Microbiology results:  none  Thank you for allowing pharmacy to be a part of this patient's care.  Seleen Walter A 07/04/2017 9:15 AM 

## 2017-07-04 NOTE — Progress Notes (Signed)
Pharmacy Antibiotic Note  Jerome Sutton is a 37 y.o. male admitted on 07/03/2017 with osteomyelitis.  Pharmacy has been consulted for VANCOMYCIN dosing.  Pt received Vancomycin 1000mg  x 1 last pm  Plan: Vancomycin 1250mg  x 1 today then 1000mg  IV q12hrs Check trough at steady state Monitor labs, progress, c/s  Height: 5\' 8"  (172.7 cm) Weight: 204 lb 1.6 oz (92.6 kg) IBW/kg (Calculated) : 68.4  Temp (24hrs), Avg:98.9 F (37.2 C), Min:98.2 F (36.8 C), Max:99.8 F (37.7 C)  Recent Labs  Lab 07/03/17 1947 07/04/17 0418  WBC 11.6* 10.4  CREATININE 0.87 0.89    Estimated Creatinine Clearance: 128 mL/min (by C-G formula based on SCr of 0.89 mg/dL).    No Known Allergies  Antimicrobials this admission: Vancomycin 1/9 >>   Dose adjustments this admission:  Microbiology results:  none  Thank you for allowing pharmacy to be a part of this patient's care.  Valrie HartHall, Laura Caldas A 07/04/2017 9:15 AM

## 2017-07-04 NOTE — Brief Op Note (Signed)
07/03/2017 - 07/04/2017  1:07 PM  PATIENT:  Mohammed KindleEllis J Lagasse  37 y.o. male  PRE-OPERATIVE DIAGNOSIS:  infection, abscess and septic arthritis left foot  POST-OPERATIVE DIAGNOSIS:  infection, abscess and septic arthritis left foot  PROCEDURE:  Procedure(s): INCISION AND DRAINAGE ABSCESS LEFT FOOT AND BONE RESECTION (Left)  Metatarsal phalangeal joint resection fourth digit Manual manipulation second and third digit interphalangeal joints  Operative findings Large abscess metatarsal phalangeal joint Proximal phalanx proximal half bone was dead, distal metatarsal distal 2 cm of bone was dated the head was not present it had morphed into a arrow point configuration of the metatarsal Plantar IPK  Procedure was done as follows  The surgical site was marked in preop chart review was completed chart update was completed Antibiotics were held for cultures Intra-Op  Patient was taken to the operating room after his site was marked and confirmed with the right foot  In the operating room general anesthesia was administered.  Timeout was completed after prep and drape with Betadine  Tourniquet was elevated to 300 mmHg after exsanguination of the limb  The incision was made over the fourth metatarsal and metatarsal phalangeal joint it was taken down to the extensor tendons.  Tenotomy of the extensor tendon to the fourth digit was performed  Purulent material was expressed from the metatarsal phalangeal joint.  This was cultured followed by irrigation of the joint.  Based on the MRI and intraoperative assessment the proximal phalanx proximal half was resected as well as the distal 2 cm of metatarsal.  This was done in a beveled fashion.  Bone was sent for culture  After thorough irrigation was completed I resected the plantar keratosis with a sharp knife  Gloves were changed wound was packed manual manipulation of the second and third MTP and PIP joints was performed  The wound was left open  and packed with iodoform gauze  Wrapped with sterile dressings and Ace bandages    SURGEON:  Surgeon(s) and Role:    * Vickki HearingHarrison, Zionah Criswell E, MD - Primary  PHYSICIAN ASSISTANT:   ASSISTANTS: none   ANESTHESIA:   general  EBL:  minimal   BLOOD ADMINISTERED:none  DRAINS: none   LOCAL MEDICATIONS USED:  NONE  SPECIMEN:  Source of Specimen:  Metatarsal phalangeal joint left foot culture sent to microbiology as well as distal metatarsal and proximal phalanx fourth digit sent for culture of the bone  DISPOSITION OF SPECIMEN:  As stated  COUNTS:  YES  TOURNIQUET:   Total Tourniquet Time Documented: Thigh (Left) - 27 minutes Total: Thigh (Left) - 27 minutes   DICTATION: .Reubin Milanragon Dictation  PLAN OF CARE: Discharge to home after PACU  PATIENT DISPOSITION:  PACU - hemodynamically stable.   Delay start of Pharmacological VTE agent (>24hrs) due to surgical blood loss or risk of bleeding: no

## 2017-07-04 NOTE — H&P (Addendum)
NEW PATIENT //new admission  Chief Complaint  Patient presents with  . Foot Pain    37 year old male status post lumbar discectomy and laminectomy several years ago developed a plantar IP K and subsequently developed intermittent swelling of his foot which would go up and down depending on elevation however on this occasion his pain and swelling persisted he presented to the emergency room on January 9 complaining of pain swelling and erythema of the foot.  His workup included a white count sed rate C-reactive protein x-ray and his x-ray shows a septic arthritis osteomyelitis fourth metatarsal head proximal phalanx fourth digit with abscess.  He complains of mild constant nonradiating pain dorsal and plantar aspect left foot with erythema and swelling painful weightbearing.    Review of Systems  Constitutional: Negative for chills, diaphoresis, fever, malaise/fatigue and weight loss.  Neurological: Positive for sensory change.  All other systems reviewed and are negative.    History reviewed. No pertinent past medical history.  No hypertension or diabetes.  He says he works out 6 days a week Past Surgical History:  Procedure Laterality Date  . BACK SURGERY    . LUMBAR LAMINECTOMY/DECOMPRESSION MICRODISCECTOMY  02/18/2012   Procedure: LUMBAR LAMINECTOMY/DECOMPRESSION MICRODISCECTOMY 1 LEVEL;  Surgeon: Mariam DollarGary P Cram, MD;  Location: [facility];  Service: Neurosurgery;  Laterality: Bilateral;  Bilateral Lumbar Laminectomy and diskectomy    History reviewed. No pertinent family history.  No pertinent family history of hypertension or heart disease Social History   Tobacco Use  . Smoking status: Never Smoker  . Smokeless tobacco: Never Used  Substance Use Topics  . Alcohol use: No  . Drug use: No    No Known Allergies   No outpatient medications have been marked as taking for the 07/03/17 encounter [facility](Hospital Encounter).    BP 131/60 (BP Location: Left Arm)   Pulse 88    Temp 98.2 F (36.8 C) (Oral)   Resp 20   Ht 5\' 8"  (1.727 m)   Wt 204 lb 1.6 oz (92.6 kg)   SpO2 99%   BMI 31.03 kg/m   Physical Exam  Constitutional: He is oriented to person, place, and time. He appears well-developed and well-nourished. No distress.  HENT:  Head: Normocephalic and atraumatic.  Right Ear: External ear normal.  Left Ear: External ear normal.  Nose: Nose normal.  Eyes: Conjunctivae and EOM are normal. Pupils are equal, round, and reactive to light. Right eye exhibits no discharge. Left eye exhibits no discharge.  Neck: Normal range of motion. Neck supple. No tracheal deviation present. No thyromegaly present.  Cardiovascular: Normal rate, regular rhythm and intact distal pulses.  Pulmonary/Chest: Effort normal. No stridor. No respiratory distress. He has no wheezes. He exhibits no tenderness.  Abdominal: Soft. He exhibits no distension and no mass. There is no guarding.  Musculoskeletal:  Right and left: Upper extremities normal range of motion stability strength and alignment neurovascular exam intact  Right lower extremity patient has hyperextension deformities of all the lesser digits and great toe with IP K plantar.  Neurovascular exam remains intact but remainder of his extremity joints are stable strength is normal no atrophy alignment is normal no tenderness  Left foot tenderness on the dorsal and plantar aspect of the foot surrounding erythema of the skin Primary pain is in the fourth metatarsal phalangeal joint there is multiple evidence of deformity of the foot and lesser digits Patient has active and passive plantarflexion which is normal.  The Achilles tendon does not  appear to be excessively tight his dorsiflexion with the knee straight and with the knee extended is approximately 5 degrees Strength and muscle tone are normal he does have some decreased sensation on the lateral border of the foot.  Stability knee and ankle normal.  Normal vascularity to the  foot is noted.  Lymphadenopathy:    He has no cervical adenopathy.  Neurological: He is alert and oriented to person, place, and time. He has normal strength. He displays no atrophy and no tremor. A sensory deficit is present. He exhibits normal muscle tone. Gait normal.  Reflex Scores:      Tricep reflexes are 2+ on the right side and 2+ on the left side.      Bicep reflexes are 2+ on the right side and 2+ on the left side.      Patellar reflexes are 2+ on the right side and 2+ on the left side.      Achilles reflexes are 1+ on the right side and 1+ on the left side. Skin: Skin is warm and dry. Capillary refill takes less than 2 seconds. He is not diaphoretic.  Psychiatric: He has a normal mood and affect. His behavior is normal. Judgment and thought content normal.    Ortho Exam  Meds ordered this encounter  Medications  . vancomycin (VANCOCIN) IVPB 1000 mg/200 mL premix    Order Specific Question:   Indication:    Answer:   Osteomyelitis  . 0.9 %  sodium chloride infusion  . morphine 2 MG/ML injection 2 mg  . HYDROcodone-acetaminophen (NORCO/VICODIN) 5-325 MG per tablet 1 tablet  . ondansetron (ZOFRAN) injection 4 mg  . alum & mag hydroxide-simeth (MAALOX/MYLANTA) 200-200-20 MG/5ML suspension 30 mL  . acetaminophen (TYLENOL) tablet 325 mg  . gadobenate dimeglumine (MULTIHANCE) injection 20 mL  . heparin injection 5,000 Units   Foot x-rays were taken.  Foot x-rays were read independently and report is included please see the dictated report.  X-ray of the left foot reveals complete dissolution of the metatarsal head #4 and proximal phalanx portion same digit.   Osteomyelitis left foot Septic arthritis left foot fourth metatarsal phalangeal joint Abscess left foot   PLAN:   Admit start on IV vancomycin.  MRI of the foot to assess bone for possible bone resection.  MRI was reviewed independently see separate report from radiology  Patient has septic arthritis  osteomyelitis proximal phalanx fourth digit distal metatarsal head and shaft fourth metatarsal abscesses noted  I discussed this with the patient and recommend that he have incision and drainage and bone resection.  Left foot.

## 2017-07-04 NOTE — Anesthesia Procedure Notes (Signed)
Procedure Name: LMA Insertion Date/Time: 07/04/2017 12:21 PM Performed by: Moshe Salisburyaniel, Georga Stys E, CRNA Pre-anesthesia Checklist: Patient identified, Patient being monitored, Emergency Drugs available, Timeout performed and Suction available Patient Re-evaluated:Patient Re-evaluated prior to induction Oxygen Delivery Method: Circle System Utilized Preoxygenation: Pre-oxygenation with 100% oxygen Induction Type: IV induction Ventilation: Mask ventilation without difficulty LMA: LMA inserted LMA Size: 4.0 Number of attempts: 1 Placement Confirmation: positive ETCO2 and breath sounds checked- equal and bilateral

## 2017-07-04 NOTE — Op Note (Signed)
07/03/2017 - 07/04/2017  1:07 PM  PATIENT:  Angela J Mcgurk  37 y.o. male  PRE-OPERATIVE DIAGNOSIS:  infection, abscess and septic arthritis left foot  POST-OPERATIVE DIAGNOSIS:  infection, abscess and septic arthritis left foot  PROCEDURE:  Procedure(s): INCISION AND DRAINAGE ABSCESS LEFT FOOT AND BONE RESECTION (Left)  Metatarsal phalangeal joint resection fourth digit Manual manipulation second and third digit interphalangeal joints  Operative findings Large abscess metatarsal phalangeal joint Proximal phalanx proximal half bone was dead, distal metatarsal distal 2 cm of bone was dated the head was not present it had morphed into a arrow point configuration of the metatarsal Plantar IPK  Procedure was done as follows  The surgical site was marked in preop chart review was completed chart update was completed Antibiotics were held for cultures Intra-Op  Patient was taken to the operating room after his site was marked and confirmed with the right foot  In the operating room general anesthesia was administered.  Timeout was completed after prep and drape with Betadine  Tourniquet was elevated to 300 mmHg after exsanguination of the limb  The incision was made over the fourth metatarsal and metatarsal phalangeal joint it was taken down to the extensor tendons.  Tenotomy of the extensor tendon to the fourth digit was performed  Purulent material was expressed from the metatarsal phalangeal joint.  This was cultured followed by irrigation of the joint.  Based on the MRI and intraoperative assessment the proximal phalanx proximal half was resected as well as the distal 2 cm of metatarsal.  This was done in a beveled fashion.  Bone was sent for culture  After thorough irrigation was completed I resected the plantar keratosis with a sharp knife  Gloves were changed wound was packed manual manipulation of the second and third MTP and PIP joints was performed  The wound was left open  and packed with iodoform gauze  Wrapped with sterile dressings and Ace bandages    SURGEON:  Surgeon(s) and Role:    * Harmoney Sienkiewicz E, MD - Primary  PHYSICIAN ASSISTANT:   ASSISTANTS: none   ANESTHESIA:   general  EBL:  minimal   BLOOD ADMINISTERED:none  DRAINS: none   LOCAL MEDICATIONS USED:  NONE  SPECIMEN:  Source of Specimen:  Metatarsal phalangeal joint left foot culture sent to microbiology as well as distal metatarsal and proximal phalanx fourth digit sent for culture of the bone  DISPOSITION OF SPECIMEN:  As stated  COUNTS:  YES  TOURNIQUET:   Total Tourniquet Time Documented: Thigh (Left) - 27 minutes Total: Thigh (Left) - 27 minutes   DICTATION: .Dragon Dictation  PLAN OF CARE: Discharge to home after PACU  PATIENT DISPOSITION:  PACU - hemodynamically stable.   Delay start of Pharmacological VTE agent (>24hrs) due to surgical blood loss or risk of bleeding: no  

## 2017-07-04 NOTE — Transfer of Care (Signed)
Immediate Anesthesia Transfer of Care Note  Patient: Jerome Sutton  Procedure(s) Performed: INCISION AND DRAINAGE ABSCESS LEFT FOOT AND FOURTH METATARSOPHALANGEAL BONE RESECTION AND MANUAL MANIPULATION OF SECOND AND THIRD TOES (Left )  Patient Location: PACU  Anesthesia Type:General  Level of Consciousness: awake and alert   Airway & Oxygen Therapy: Patient Spontanous Breathing and Patient connected to nasal cannula oxygen  Post-op Assessment: Report given to RN  Post vital signs: Reviewed and stable  Last Vitals:  Vitals:   07/04/17 1200 07/04/17 1205  BP: (!) 145/81   Pulse:    Resp: (!) 22 10  Temp:    SpO2: 93% 97%    Last Pain:  Vitals:   07/04/17 1136  TempSrc: Oral  PainSc: 0-No pain      Patients Stated Pain Goal: 9 (07/04/17 1136)  Complications: No apparent anesthesia complications

## 2017-07-04 NOTE — Interval H&P Note (Signed)
History and Physical Interval Note:  07/04/2017 12:00 PM  Jerome Sutton  has presented today for surgery, with the diagnosis of abscess left foot septic arthritis  The various methods of treatment have been discussed with the patient and family. After consideration of risks, benefits and other options for treatment, the patient has consented to  Procedure(s): INCISION AND DRAINAGE ABSCESS, left foot (Left) as a surgical intervention .  The patient's history has been reviewed, patient examined, no change in status, stable for surgery.  I have reviewed the patient's chart and labs.  Questions were answered to the patient's satisfaction.     Fuller CanadaStanley Harrison

## 2017-07-04 NOTE — Anesthesia Postprocedure Evaluation (Signed)
Anesthesia Post Note  Patient: Jerome Sutton  Procedure(s) Performed: INCISION AND DRAINAGE ABSCESS LEFT FOOT AND FOURTH METATARSOPHALANGEAL BONE RESECTION AND MANUAL MANIPULATION OF SECOND AND THIRD TOES (Left )  Patient location during evaluation: PACU Anesthesia Type: General Level of consciousness: awake and alert and oriented Pain management: pain level controlled Vital Signs Assessment: post-procedure vital signs reviewed and stable Respiratory status: spontaneous breathing Cardiovascular status: blood pressure returned to baseline and stable Postop Assessment: no apparent nausea or vomiting and adequate PO intake Anesthetic complications: no     Last Vitals:  Vitals:   07/04/17 1330 07/04/17 1345  BP: (!) 148/83 (!) 145/94  Pulse: 81 72  Resp: 13 12  Temp:    SpO2: 100% 100%    Last Pain:  Vitals:   07/04/17 1320  TempSrc:   PainSc: 5                  Yvanna Vidas

## 2017-07-05 ENCOUNTER — Encounter (HOSPITAL_COMMUNITY): Payer: Self-pay | Admitting: Orthopedic Surgery

## 2017-07-05 LAB — CBC WITH DIFFERENTIAL/PLATELET
BASOS ABS: 0 10*3/uL (ref 0.0–0.1)
BASOS PCT: 0 %
EOS ABS: 0.1 10*3/uL (ref 0.0–0.7)
Eosinophils Relative: 1 %
HCT: 40.5 % (ref 39.0–52.0)
HEMOGLOBIN: 12.5 g/dL — AB (ref 13.0–17.0)
LYMPHS ABS: 1.8 10*3/uL (ref 0.7–4.0)
Lymphocytes Relative: 22 %
MCH: 28 pg (ref 26.0–34.0)
MCHC: 30.9 g/dL (ref 30.0–36.0)
MCV: 90.8 fL (ref 78.0–100.0)
Monocytes Absolute: 1.4 10*3/uL — ABNORMAL HIGH (ref 0.1–1.0)
Monocytes Relative: 17 %
NEUTROS ABS: 4.7 10*3/uL (ref 1.7–7.7)
Neutrophils Relative %: 60 %
Platelets: 210 10*3/uL (ref 150–400)
RBC: 4.46 MIL/uL (ref 4.22–5.81)
RDW: 12.6 % (ref 11.5–15.5)
WBC: 8 10*3/uL (ref 4.0–10.5)

## 2017-07-05 LAB — BASIC METABOLIC PANEL
Anion gap: 10 (ref 5–15)
BUN: 9 mg/dL (ref 6–20)
CALCIUM: 8.6 mg/dL — AB (ref 8.9–10.3)
CO2: 27 mmol/L (ref 22–32)
CREATININE: 0.91 mg/dL (ref 0.61–1.24)
Chloride: 100 mmol/L — ABNORMAL LOW (ref 101–111)
GFR calc Af Amer: >60 mL/min (ref >60)
GLUCOSE: 101 mg/dL — AB (ref 65–99)
Potassium: 4.3 mmol/L (ref 3.5–5.1)
Sodium: 137 mmol/L (ref 135–145)

## 2017-07-05 MED ORDER — VANCOMYCIN HCL IN DEXTROSE 1-5 GM/200ML-% IV SOLN
1000.0000 mg | Freq: Three times a day (TID) | INTRAVENOUS | Status: DC
  Administered 2017-07-05 – 2017-07-06 (×2): 1000 mg via INTRAVENOUS
  Filled 2017-07-05 (×3): qty 200

## 2017-07-05 NOTE — Progress Notes (Signed)
OT Cancellation Note  Patient Details Name: Jerome Sutton MRN: [id number] DOB: 05/18/1981   Cancelled Treatment:    Reason Eval/Treat Not Completed: OT screened, no needs identified, will sign off. Chart reviewed, pt screened for OT needs. Pt lives alone however will have assistance for ADLs as needed on discharge. No further OT services required at this time.   Ezra SitesLeslie Velvia Mehrer, OTR/L  [phone] 07/05/2017, 8:37 AM

## 2017-07-05 NOTE — Plan of Care (Signed)
  Progressing Acute Rehab PT Goals(only PT should resolve) Patient Will Transfer Sit To/From Stand 07/05/2017 1031 - Progressing by Verne CarrowGill, Petrita Blunck, PT Flowsheets Taken 07/05/2017 1031  Patient will transfer sit to/from stand with modified independence Pt Will Transfer Bed To Chair/Chair To Bed 07/05/2017 1031 - Progressing by Verne CarrowGill, Jaziah Goeller, PT Flowsheets Taken 07/05/2017 1031  Pt will Transfer Bed to Chair/Chair to Bed with modified independence Pt Will Ambulate 07/05/2017 1031 - Progressing by Verne CarrowGill, Doc Mandala, PT Flowsheets Taken 07/05/2017 1031  Pt will Ambulate > 125 feet;with rolling walker;with modified independence Pt Will Verbalize and Adhere to Precautions While Description PT Will Verbalize and Adhere to Precautions While Performing Mobility 07/05/2017 1031 - Progressing by Verne CarrowGill, Sapphira Harjo, PT Note Patient is doing this well, will continue to monitor   Verne CarrowMacy Rainee Sweatt PT, DPT 10:32 AM, 07/05/17 [phone]

## 2017-07-05 NOTE — Evaluation (Signed)
Physical Therapy Evaluation Patient Details Name: Jerome Sutton J Diop MRN: [id number] DOB: 04/15/1981 Today's Date: 07/05/2017   History of Present Illness  37 year old male status post lumbar discectomy and laminectomy several years ago developed a plantar IP K and subsequently developed intermittent swelling of his foot which would go up and down depending on elevation however on this occasion his pain and swelling persisted he presented to the emergency room on January 9 complaining of pain swelling and erythema of the foot. His workup included a white count sed rate C-reactive protein x-ray and his x-ray shows a septic arthritis osteomyelitis fourth metatarsal head proximal phalanx fourth digit with abscess. Patient presented with the diagnosis of abscess left foot septic arthritis. Patient is s/p incision and drainage abscess, left foot.   Clinical Impression    Patient was awake and alert in bed upon entering room with lower extremities elevated. Patient performed bed mobility independently. Patient performed sit to stand with modified independence demonstrating good safety using rolling walker to stand. Patient was educated about non weight bearing precautions and educated how to ambulate with a rolling walker with these precautions. Patient performed ambulation for 125 feet with rolling walker with modified independence and supervision. Patient denied any tingling numbness or any dizziness throughout the session. Patient would benefit from continued physical therapy in order to address deficits in strength, ROM, balance, and overall functional mobility.    Follow Up Recommendations Outpatient PT    Equipment Recommendations  Rolling walker with 5" wheels(Shower chair potentially)    Recommendations for Other Services       Precautions / Restrictions Precautions Precautions: Fall Restrictions Weight Bearing Restrictions: Yes LLE Weight Bearing: Non weight bearing      Mobility  Bed  Mobility Overal bed mobility: Independent             General bed mobility comments: Patient able to perform bed mobility independently to perform supine to sit and scoot to edge of bed.   Transfers Overall transfer level: Modified independent Equipment used: Rolling walker (2 wheeled)             General transfer comment: Patient is non weight bearing on the left lower extremity. Patient was able to safely perform sit to stand using upper extremities and rolling walker without placing any weight through the left lower extremity.   Ambulation/Gait Ambulation/Gait assistance: Modified independent (Device/Increase time);Supervision Ambulation Distance (Feet): 125 Feet Assistive device: Rolling walker (2 wheeled)     Gait velocity interpretation: Below normal speed for age/gender(Decreased speed due to weight bearing status) General Gait Details: Non- weight bearing on left lower extremity, but demonstrated good safety with performing non-weight bearing ambulation.   Stairs            Wheelchair Mobility    Modified Rankin (Stroke Patients Only)       Balance Overall balance assessment: Modified Independent                                           Pertinent Vitals/Pain Pain Assessment: No/denies pain    Home Living Family/patient expects to be discharged to:: Private residence Living Arrangements: Spouse/significant other Available Help at Discharge: Family Type of Home: House Home Access: Level entry     Home Layout: Able to live on main level with bedroom/bathroom;Other (Comment)(Patient stated his house has a basement, but he does not need to use it)  Home Equipment: None      Prior Function Level of Independence: Independent               Hand Dominance        Extremity/Trunk Assessment   Upper Extremity Assessment Upper Extremity Assessment: Defer to OT evaluation    Lower Extremity Assessment Lower Extremity  Assessment: LLE deficits/detail LLE Deficits / Details: Patient's left foot was wrapped in ace bandage and was unable to assess fully for strength and range of motion due to this. However, it appeared weaker and is non-weight bearing.     Cervical / Trunk Assessment Cervical / Trunk Assessment: Normal  Communication   Communication: No difficulties  Cognition Arousal/Alertness: Awake/alert Behavior During Therapy: WFL for tasks assessed/performed Overall Cognitive Status: Within Functional Limits for tasks assessed                                 General Comments: Patient correctly reported name and date of birth.       General Comments General comments (skin integrity, edema, etc.): Patient denied any tingling or numbness. Left foot and ankle wrapped in bandaging and ace wrap so unable to fully assess.     Exercises     Assessment/Plan    PT Assessment Patient needs continued PT services  PT Problem List Decreased strength;Decreased range of motion;Decreased activity tolerance;Decreased mobility       PT Treatment Interventions DME instruction;Balance training;Gait training;Functional mobility training;Patient/family education;Therapeutic activities;Therapeutic exercise    PT Goals (Current goals can be found in the Care Plan section)  Acute Rehab PT Goals Patient Stated Goal: Return home  PT Goal Formulation: With patient Time For Goal Achievement: 07/08/17 Potential to Achieve Goals: Good    Frequency Min 5X/week   Barriers to discharge        Co-evaluation               AM-PAC PT "6 Clicks" Daily Activity  Outcome Measure Difficulty turning over in bed (including adjusting bedclothes, sheets and blankets)?: None Difficulty moving from lying on back to sitting on the side of the bed? : None Difficulty sitting down on and standing up from a chair with arms (e.g., wheelchair, bedside commode, etc,.)?: A Little Help needed moving to and from a  bed to chair (including a wheelchair)?: A Little Help needed walking in hospital room?: A Little Help needed climbing 3-5 steps with a railing? : A Little 6 Click Score: 20    End of Session Equipment Utilized During Treatment: Gait belt Activity Tolerance: Patient tolerated treatment well;No increased pain Patient left: in bed;with call bell/phone within reach Nurse Communication: Mobility status PT Visit Diagnosis: Muscle weakness (generalized) (M62.81);Difficulty in walking, not elsewhere classified (R26.2);Unsteadiness on feet (R26.81)    Time: 1610-9604 PT Time Calculation (min) (ACUTE ONLY): 22 min   Charges:   PT Evaluation $PT Eval Low Complexity: 1 Low PT Treatments $Gait Training: 8-22 mins   PT G Codes:        Verne Carrow PT, DPT 10:30 AM, 07/05/17 [phone]

## 2017-07-05 NOTE — Progress Notes (Signed)
Patient ID: Jerome Sutton, male   DOB: 10/20/1980, 37 y.o.   MRN: [id number]  BP (!) 114/55 (BP Location: Right Arm)   Pulse 69   Temp 98.7 F (37.1 C) (Oral)   Resp 14   Ht 5\' 8"  (1.727 m)   Wt 204 lb (92.5 kg)   SpO2 100%   BMI 31.02 kg/m   Postop day 1 status post incision and drainage of abscess septic arthritis and chronic osteomyelitis left foot  Patient is comfortable.  He is afebrile.  CBC Latest Ref Rng & Units 07/05/2017 07/04/2017 07/03/2017  WBC 4.0 - 10.5 K/uL 8.0 10.4 11.6(H)  Hemoglobin 13.0 - 17.0 g/dL 12.5(L) 13.4 13.9  Hematocrit 39.0 - 52.0 % 40.5 43.7 43.5  Platelets 150 - 400 K/uL 210 241 234   BMP Latest Ref Rng & Units 07/05/2017 07/04/2017 07/03/2017  Glucose 65 - 99 mg/dL 409(W101(H) 119(J107(H) 478(G120(H)  BUN 6 - 20 mg/dL 9 9 9   Creatinine 0.61 - 1.24 mg/dL 9.560.91 2.130.89 0.860.87  Sodium 135 - 145 mmol/L 137 139 142  Potassium 3.5 - 5.1 mmol/L 4.3 4.2 3.8  Chloride 101 - 111 mmol/L 100(L) 101 103  CO2 22 - 32 mmol/L 27 27 27   Calcium 8.9 - 10.3 mg/dL 5.7(Q8.6(L) 9.1 9.4    He is currently on vancomycin through pharmacy protocol  BUN and creatinine are stable white count is coming down  Dressing change today  Microbiology report pending we will start p.o. antibiotics once we get an ID on the culture  Discharge plan for tomorrow

## 2017-07-05 NOTE — Progress Notes (Signed)
Pharmacy Antibiotic Note  Jerome Sutton is a 37 y.o. male admitted on 07/03/2017 with osteomyelitis.  Pharmacy has been consulted for VANCOMYCIN dosing.   Plan: Increase Vancomycin 1000mg  IV q8hrs Check trough at steady state Monitor labs, progress, c/s  Height: 5\' 8"  (172.7 cm) Weight: 204 lb (92.5 kg) IBW/kg (Calculated) : 68.4  Temp (24hrs), Avg:98.6 F (37 C), Min:97.6 F (36.4 C), Max:99.4 F (37.4 C)  Recent Labs  Lab 07/03/17 1947 07/04/17 0418 07/05/17 0513  WBC 11.6* 10.4 8.0  CREATININE 0.87 0.89 0.91    Estimated Creatinine Clearance: 125 mL/min (by C-G formula based on SCr of 0.91 mg/dL).    No Known Allergies  Antimicrobials this admission: Vancomycin 1/9 >>   Dose adjustments this admission: 1/11 Increase vancomycin 1000mg  IV q8h  Microbiology results:  1/10 Bone cx: GPR, GNR, and GPC  Thank you for allowing pharmacy to be a part of this patient's care. Elder CyphersLorie Maurianna Benard, BS Pharm D, BCPS Clinical Pharmacist Pager [phone] 07/05/2017 10:20 AM

## 2017-07-06 LAB — BASIC METABOLIC PANEL
Anion gap: 8 (ref 5–15)
BUN: 12 mg/dL (ref 6–20)
CHLORIDE: 102 mmol/L (ref 101–111)
CO2: 27 mmol/L (ref 22–32)
Calcium: 8.5 mg/dL — ABNORMAL LOW (ref 8.9–10.3)
Creatinine, Ser: 0.78 mg/dL (ref 0.61–1.24)
GFR calc Af Amer: >60 mL/min (ref >60)
GFR calc non Af Amer: >60 mL/min (ref >60)
Glucose, Bld: 104 mg/dL — ABNORMAL HIGH (ref 65–99)
POTASSIUM: 3.8 mmol/L (ref 3.5–5.1)
Sodium: 137 mmol/L (ref 135–145)

## 2017-07-06 LAB — CBC WITH DIFFERENTIAL/PLATELET
BASOS PCT: 1 %
Basophils Absolute: 0.1 10*3/uL (ref 0.0–0.1)
EOS PCT: 3 %
Eosinophils Absolute: 0.2 10*3/uL (ref 0.0–0.7)
HEMATOCRIT: 39.9 % (ref 39.0–52.0)
Hemoglobin: 12.3 g/dL — ABNORMAL LOW (ref 13.0–17.0)
LYMPHS ABS: 1.2 10*3/uL (ref 0.7–4.0)
Lymphocytes Relative: 21 %
MCH: 28 pg (ref 26.0–34.0)
MCHC: 30.8 g/dL (ref 30.0–36.0)
MCV: 90.9 fL (ref 78.0–100.0)
MONOS PCT: 18 %
Monocytes Absolute: 1 10*3/uL (ref 0.1–1.0)
Neutro Abs: 3.1 10*3/uL (ref 1.7–7.7)
Neutrophils Relative %: 57 %
Platelets: 222 10*3/uL (ref 150–400)
RBC: 4.39 MIL/uL (ref 4.22–5.81)
RDW: 12.5 % (ref 11.5–15.5)
WBC: 5.6 10*3/uL (ref 4.0–10.5)

## 2017-07-06 LAB — VANCOMYCIN, TROUGH: Vancomycin Tr: 10 ug/mL — ABNORMAL LOW (ref 15–20)

## 2017-07-06 MED ORDER — HYDROCODONE-ACETAMINOPHEN 5-325 MG PO TABS: 1.0000 | ORAL_TABLET | ORAL | 0 refills | Status: DC | PRN

## 2017-07-06 MED ORDER — VANCOMYCIN HCL 10 G IV SOLR
1250.0000 mg | Freq: Three times a day (TID) | INTRAVENOUS | Status: DC
  Administered 2017-07-06: 1250 mg via INTRAVENOUS
  Filled 2017-07-06 (×7): qty 1250

## 2017-07-06 MED ORDER — CIPROFLOXACIN HCL 500 MG PO TABS: 500.0000 mg | ORAL_TABLET | Freq: Two times a day (BID) | ORAL | 2 refills | Status: AC

## 2017-07-06 NOTE — Discharge Summary (Addendum)
Physician Discharge Summary  Patient ID: Jerome Sutton MRN: [id number] DOB/AGE: 36/11/1980 36 y.o.  Admit date: 07/03/2017 Discharge date: 07/06/2017  Admission Diagnoses:  Discharge Diagnoses:  Active Problems:   Abscess of foot   Osteomyelitis of left foot [facility](HCC)   Discharged Condition: good  Hospital Course: 37 year old male admitted for abscess and osteomyelitis left foot.  Patient underwent incision and drainage with bone resection and packing left foot on January 10.  On January 11 he was able to tolerate ambulation.  He was afebrile  His white count was normal  His culture showed a mixed bacterial flora including gram-positive cocci and gram-negative rods  Consults: None  Discharge Exam: Blood pressure 123/64, pulse 67, temperature 98.2 F (36.8 C), temperature source Oral, resp. rate 18, height 5\' 8"  (1.727 m), weight 204 lb (92.5 kg), SpO2 100 %.   Disposition: 01-Home or Self Care   Allergies as of 07/06/2017   No Known Allergies     Medication List    TAKE these medications   ciprofloxacin 500 MG tablet Commonly known as:  CIPRO Take 1 tablet (500 mg total) by mouth 2 (two) times daily. Take for 6 weeks   HYDROcodone-acetaminophen 5-325 MG tablet Commonly known as:  NORCO/VICODIN Take 1 tablet by mouth every 4 (four) hours as needed for moderate pain.      Follow-up Information    Vickki HearingHarrison, Seraphim Trow E, MD Follow up on 07/12/2017.   Specialties:  Orthopedic Surgery, Radiology Contact information: [address] [place] [address] [phone]           Signed: Fuller CanadaStanley Nayson Traweek 07/06/2017, 10:49 AM

## 2017-07-06 NOTE — Progress Notes (Signed)
Pharmacy Antibiotic Note  Jerome Sutton is a 37 y.o. male admitted on 07/03/2017 with osteomyelitis.  Pharmacy has been consulted for VANCOMYCIN dosing.  Vancomycin trough 3610mcg/ml, not at goal will adjust. Expect a little dose accumulaton with change in interval.   Plan: Increase Vancomycin 1250mg  IV q8hrs goal is 15-5820mcg/ml Check trough at steady state Monitor labs, progress, c/s  Height: 5\' 8"  (172.7 cm) Weight: 204 lb (92.5 kg) IBW/kg (Calculated) : 68.4  Temp (24hrs), Avg:98.8 F (37.1 C), Min:98.2 F (36.8 C), Max:99.8 F (37.7 C)  Recent Labs  Lab 07/03/17 1947 07/04/17 0418 07/05/17 0513 07/06/17 0536 07/06/17 0844  WBC 11.6* 10.4 8.0 5.6  --   CREATININE 0.87 0.89 0.91 0.78  --   VANCOTROUGH  --   --   --   --  10*    Estimated Creatinine Clearance: 142.2 mL/min (by C-G formula based on SCr of 0.78 mg/dL).    No Known Allergies  Antimicrobials this admission: Vancomycin 1/9 >>   Dose adjustments this admission: 1/11 Increase vancomycin 1000mg  IV q8h 1/12 Increase vancomycin 1250mg  IV q8h  Microbiology results:  1/10 Bone cx: GPR, GNR, and GPC  Thank you for allowing pharmacy to be a part of this patient's care. Elder CyphersLorie Tasnia Spegal, BS Pharm D, BCPS Clinical Pharmacist Pager [phone] 07/06/2017 10:00 AM

## 2017-07-06 NOTE — Discharge Instructions (Addendum)
Keep clean and dry  Try not to put weight on the foot when walking  You will be out of work for 6 weeks

## 2017-07-06 NOTE — Care Management Note (Signed)
Case Management Note  Patient Details  Name: Jerome Sutton MRN: [id number] Date of Birth: 12/15/1980  Subjective/Objective:                 Spoke to patient on the phine, [facility]. No other CM needs identified at this time.    Action/Plan:   Expected Discharge Date:  07/06/17               Expected Discharge Plan:  Home w Home Health Services  In-House Referral:     Discharge planning Services  CM Consult  Post Acute Care Choice:  Home Health Choice offered to:  Patient  DME Arranged:    DME Agency:     HH Arranged:  RN HH Agency:  [facility]  Status of Service:  Completed, signed off  If discussed at Long Length of Stay Meetings, dates discussed:    Additional Comments:  Jerome SabalDebbie Amaryllis Malmquist, RN 07/06/2017, 11:45 AM

## 2017-07-09 ENCOUNTER — Telehealth: Payer: Self-pay | Admitting: Orthopedic Surgery

## 2017-07-09 LAB — AEROBIC/ANAEROBIC CULTURE W GRAM STAIN (SURGICAL/DEEP WOUND)

## 2017-07-09 LAB — AEROBIC/ANAEROBIC CULTURE (SURGICAL/DEEP WOUND)

## 2017-07-09 NOTE — Progress Notes (Signed)
REVIEWED.  

## 2017-07-09 NOTE — Telephone Encounter (Signed)
[facility] from [facility] - ph# [phone] - requests verbal orders regarding wound care.

## 2017-07-09 NOTE — Telephone Encounter (Signed)
Note date and time!  I just copied this right out of the chart .Marland Kitchen....   Referral Notes  Number of Notes: 1  Type Date User Summary Attachment Provider Comments 07/06/2017 11:06 AM Vickki HearingHarrison, Stanley E, MD Provider Comments - Note   Please evaluate Jerome Sutton for [facility].  Disciplines requested: Nursing  Services to provide: [facility]  Physician to follow patient's care (the person listed here will be responsible for signing ongoing orders): PCP  Requested Start of Care Date: Within 2-3 days  I certify that this patient is under my care and that I, or a Nurse Practitioner or Physician's Assistant working with me, had a face-to-face encounter that meets the physician face-to-face requirements with patient on 07/06/2017. The encounter with the patient was in whole, or in part for the following medical condition(s) which is the primary reason for home health care (List medical condition). Osteomyelitis and abscess left foot s/p I and D with open wound   Special Instructions:  Pack wet to dry every other day

## 2017-07-09 NOTE — Telephone Encounter (Signed)
I know you put in orders at the hospital, but they can not find them  What are the wound care orders? I can give verbal orders

## 2017-07-10 NOTE — Telephone Encounter (Signed)
Called with VO wet to dry every other day.

## 2017-07-12 ENCOUNTER — Encounter: Payer: Self-pay | Admitting: Orthopedic Surgery

## 2017-07-12 ENCOUNTER — Ambulatory Visit (INDEPENDENT_AMBULATORY_CARE_PROVIDER_SITE_OTHER): Payer: BLUE CROSS/BLUE SHIELD | Admitting: Orthopedic Surgery

## 2017-07-12 VITALS — BP 160/100 | HR 90 | Ht 67.0 in | Wt 205.0 lb

## 2017-07-12 DIAGNOSIS — M86272 Subacute osteomyelitis, left ankle and foot: Secondary | ICD-10-CM

## 2017-07-12 DIAGNOSIS — L02619 Cutaneous abscess of unspecified foot: Secondary | ICD-10-CM

## 2017-07-12 NOTE — Progress Notes (Signed)
Postop visit #1  Chief Complaint  Patient presents with  . Post-op Follow-up    left foot 07/04/17 I and D    Currently on p.o. antibiotics  Complains of no pain has some mild drainage good granulation bed dressing changes being done follow-up in 2 weeks continue current antibiotics and dressing changes   Current Outpatient Medications:  .  ciprofloxacin (CIPRO) 500 MG tablet, Take 1 tablet (500 mg total) by mouth 2 (two) times daily. Take for 6 weeks, Disp: 28 tablet, Rfl: 2 .  HYDROcodone-acetaminophen (NORCO/VICODIN) 5-325 MG tablet, Take 1 tablet by mouth every 4 (four) hours as needed for moderate pain., Disp: 30 tablet, Rfl: 0  Bone; Tissue       Component 8d ago  Specimen Description BONE   Special Requests NONE   Gram Stain MODERATE WBC PRESENT, PREDOMINANTLY PMN  MODERATE GRAM POSITIVE COCCI IN PAIRS IN CLUSTERS  FEW GRAM NEGATIVE RODS  RARE GRAM POSITIVE RODS  Performed at [facility], [address] [place], [place] [address]     Culture ABUNDANT MORGANELLA MORGANII  ABUNDANT PROVIDENCIA RETTGERI  MIXED ANAEROBIC FLORA PRESENT.  CALL LAB IF FURTHER IID REQUIRED.     Report Status 07/09/2017 FINAL   Organism ID, Bacteria MORGANELLA MORGANII   Organism ID, Bacteria PROVIDENCIA RETTGERI   Resulting Agency [facility]  Susceptibility    Morganella morganii Providencia rettgeri    MIC MIC    AMPICILLIN RESISTANT  Resistant RESISTANT  Resistant    AMPICILLIN/SULBACTAM <=2 SENSITIVE  Sensitive <=2 SENSITIVE  Sensitive    CEFAZOLIN RESISTANT  Resistant >=64 RESIST... Resistant    CEFEPIME <=1 SENSITIVE  Sensitive <=1 SENSITIVE  Sensitive    CEFTAZIDIME <=1 SENSITIVE  Sensitive <=1 SENSITIVE  Sensitive    CEFTRIAXONE <=1 SENSITIVE  Sensitive <=1 SENSITIVE  Sensitive    CIPROFLOXACIN <=0.25 SENS... Sensitive <=0.25 SENS... Sensitive    GENTAMICIN <=1 SENSITIVE  Sensitive <=1 SENSITIVE  Sensitive    IMIPENEM 1 SENSITIVE  Sensitive 1 SENSITIVE  Sensitive    PIP/TAZO <=4 SENSITIVE  Sensitive <=4 SENSITIVE  Sensitive    TRIMETH/SULFA <=20 SENSIT... Sensitive <=20 SENSIT... Sensitive           Susceptibility Comments   Morganella morganii  ABUNDANT MORGANELLA MORGANII  Providencia rettgeri  ABUNDANT PROVIDENCIA RETTGERI      Specimen Collected: 07/04/17 13:20 Last Resulted: 07/09/17 14:31

## 2017-07-17 ENCOUNTER — Encounter: Payer: Self-pay | Admitting: Orthopedic Surgery

## 2017-07-24 ENCOUNTER — Telehealth: Payer: Self-pay | Admitting: Orthopedic Surgery

## 2017-07-24 NOTE — Telephone Encounter (Signed)
Jerome Sutton from [facility] needs verbal order for continuing home visits for wound care for 3 weeks  Please call her at [phone]  Thanks so much

## 2017-07-24 NOTE — Telephone Encounter (Signed)
Called with verbal order °

## 2017-07-26 ENCOUNTER — Ambulatory Visit (INDEPENDENT_AMBULATORY_CARE_PROVIDER_SITE_OTHER): Payer: BLUE CROSS/BLUE SHIELD | Admitting: Orthopedic Surgery

## 2017-07-26 ENCOUNTER — Encounter: Payer: Self-pay | Admitting: Orthopedic Surgery

## 2017-07-26 VITALS — BP 141/91 | HR 105 | Ht 67.0 in | Wt 205.0 lb

## 2017-07-26 DIAGNOSIS — M86272 Subacute osteomyelitis, left ankle and foot: Secondary | ICD-10-CM

## 2017-07-26 DIAGNOSIS — L02619 Cutaneous abscess of unspecified foot: Secondary | ICD-10-CM

## 2017-07-26 NOTE — Progress Notes (Signed)
POST OP VISIT   Patient ID: Mohammed Kindlellis J Zapf, male   DOB: 09/12/1980, 37 y.o.   MRN: [id number]  Chief Complaint  Patient presents with  . Follow-up    Recheck on left foot, DOS 07-04-17.    Encounter Diagnoses  Name Primary?  Marland Kitchen. Abscess of foot s/p I and D 07/04/17 Yes  . Subacute osteomyelitis of left foot (HCC)    Abscess left foot healing well with his 2 cm length is about 6-7 cm now all superficial  Continue current antibiotics and dressing changes return in about 2-3 weeks  Encounter Diagnoses  Name Primary?  Marland Kitchen. Abscess of foot s/p I and D 07/04/17 Yes  . Subacute osteomyelitis of left foot (HCC)     Current Outpatient Medications:  .  ciprofloxacin (CIPRO) 500 MG tablet, Take 1 tablet (500 mg total) by mouth 2 (two) times daily. Take for 6 weeks, Disp: 28 tablet, Rfl: 2 .  HYDROcodone-acetaminophen (NORCO/VICODIN) 5-325 MG tablet, Take 1 tablet by mouth every 4 (four) hours as needed for moderate pain., Disp: 30 tablet, Rfl: 0

## 2017-08-02 ENCOUNTER — Other Ambulatory Visit: Payer: Self-pay | Admitting: Orthopedic Surgery

## 2017-08-12 ENCOUNTER — Encounter: Payer: Self-pay | Admitting: Orthopedic Surgery

## 2017-08-12 ENCOUNTER — Ambulatory Visit (INDEPENDENT_AMBULATORY_CARE_PROVIDER_SITE_OTHER): Payer: BLUE CROSS/BLUE SHIELD | Admitting: Orthopedic Surgery

## 2017-08-12 VITALS — BP 82/63 | HR 62 | Ht 67.0 in | Wt 205.0 lb

## 2017-08-12 DIAGNOSIS — L02619 Cutaneous abscess of unspecified foot: Secondary | ICD-10-CM

## 2017-08-12 NOTE — Progress Notes (Signed)
Postop appointment  This is postop day #39  Chief Complaint  Patient presents with  . Post-op Follow-up    left foot s/p I and D 07/04/17    The wound has essentially closed except for the superficial portion there are 5 mm of granulation tissue no depth is noted to the wound is 1-2 mm wide  The patient can stop his antibiotics, weight-bear as tolerated pop, return in 2 weeks with a hopeful return to work after that visit  Encounter Diagnosis  Name Primary?  Marland Kitchen. Abscess of foot left s/p I and D 07/04/17 Yes

## 2017-08-12 NOTE — Patient Instructions (Signed)
Continue wound care  Stop antibiotics  Weightbearing as tolerated

## 2017-08-28 ENCOUNTER — Encounter: Payer: Self-pay | Admitting: Orthopedic Surgery

## 2017-08-28 ENCOUNTER — Ambulatory Visit (INDEPENDENT_AMBULATORY_CARE_PROVIDER_SITE_OTHER): Payer: BLUE CROSS/BLUE SHIELD | Admitting: Orthopedic Surgery

## 2017-08-28 VITALS — BP 194/144 | HR 60 | Ht 67.0 in | Wt 211.0 lb

## 2017-08-28 DIAGNOSIS — M86272 Subacute osteomyelitis, left ankle and foot: Secondary | ICD-10-CM

## 2017-08-28 DIAGNOSIS — L02619 Cutaneous abscess of unspecified foot: Secondary | ICD-10-CM

## 2017-08-28 NOTE — Patient Instructions (Signed)
Return to work Monday on March 11

## 2017-08-28 NOTE — Progress Notes (Signed)
Postop follow-up visit status post incision and drainage  Chief Complaint  Patient presents with  . Post-op Follow-up    left foot I and D 07/04/17    The wound has closed there are no signs of infection the patient is desiring to go back to work on the 11th which is fine.  If he has any problems he will call us back

## 2017-09-05 ENCOUNTER — Telehealth: Payer: Self-pay | Admitting: Orthopedic Surgery

## 2017-09-05 NOTE — Telephone Encounter (Signed)
Patient called, question regarding left foot/ankle - states swelling again. York SpanielSaid did return to work as scheduled. Please advise.  Ph# [phone]

## 2017-09-06 NOTE — Telephone Encounter (Signed)
I called patient about this, to see if the swelling goes down at night, and if it is painful  He states the swelling does come down, he feels like his achilles tendon is tight, I have told him we do expect swelling, and to do toe raises when he is in the gym to stretch his achilles  He will let me know if the swelling does not reduce in the evening, or if he develops any pain  He will decrease his activity level also, states has been up 15-16 hours last few days, will make sure he elevates in the evening until it gets better  To you FYI, if you have anything to add, I can call him back

## 2018-09-22 IMAGING — MR MR FOOT*L* WO/W CM
4 of 9 series · 19 of 40 positions shown · IV contrast (multihance)
Comparison: 07/03/2017

CLINICAL DATA: Foot numbness and swelling. Radiographs showed bony
destruction at the fourth toe MTP joint.

EXAM:
MRI OF THE LEFT FOREFOOT WITHOUT AND WITH CONTRAST
TECHNIQUE: Multiplanar, multisequence MR imaging of the left forefoot was
performed both before and after administration of intravenous
contrast.
CONTRAST:  20mL MULTIHANCE GADOBENATE DIMEGLUMINE 529 MG/ML IV SOLN

[Series 6: t1fs axial · coronal · 3.0mm · 0.26mm/px · 6 of 40 slices shown]
[im 1/40]
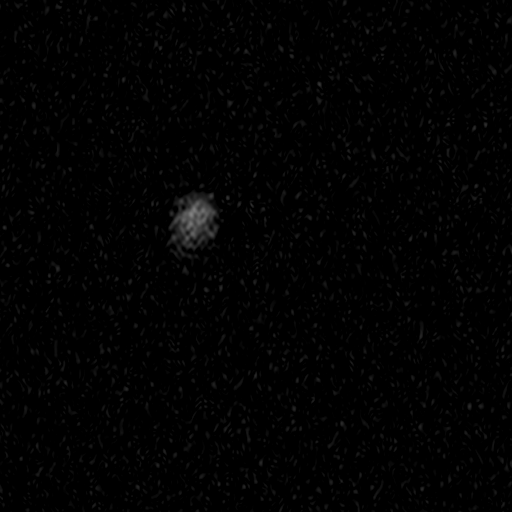
[im 8/40]
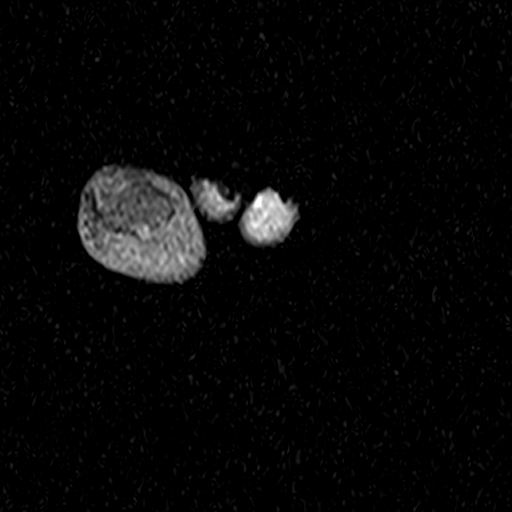
[im 16/40]
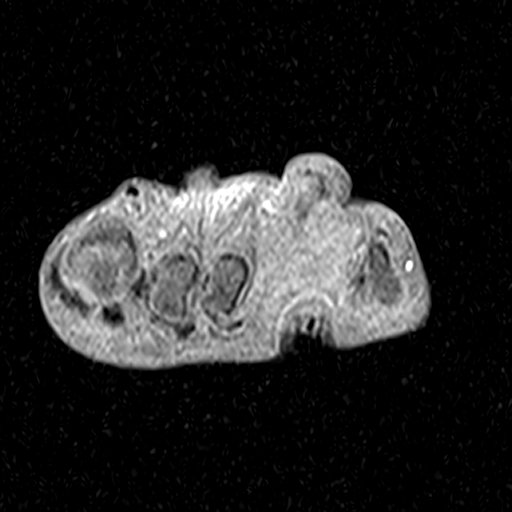
[im 24/40]
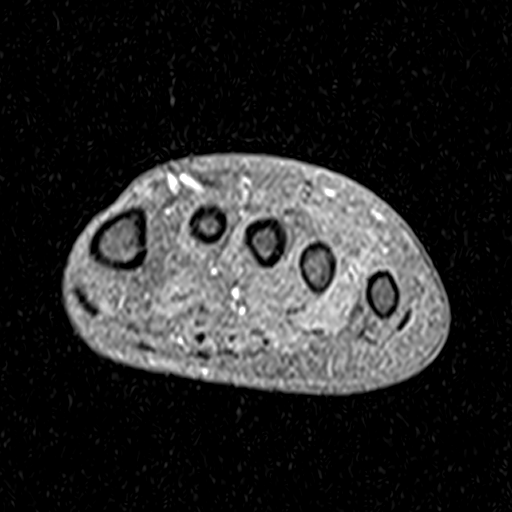
[im 32/40]
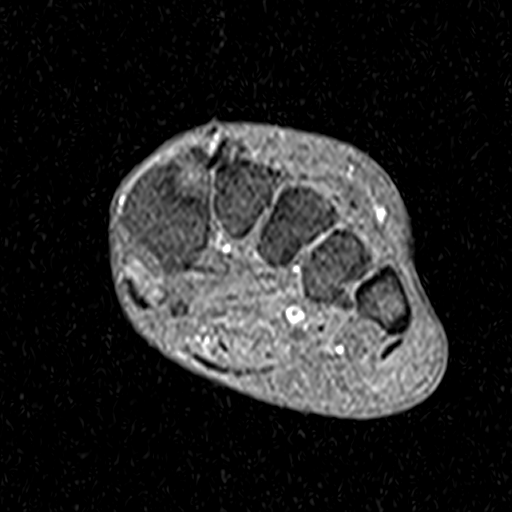
[im 40/40]
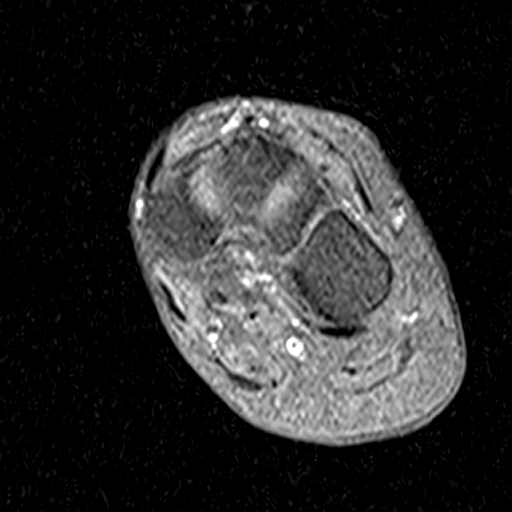

[Series 7: t2fs axial · coronal · 3.0mm · 0.26mm/px · 5 of 40 slices shown]
[im 1/40]
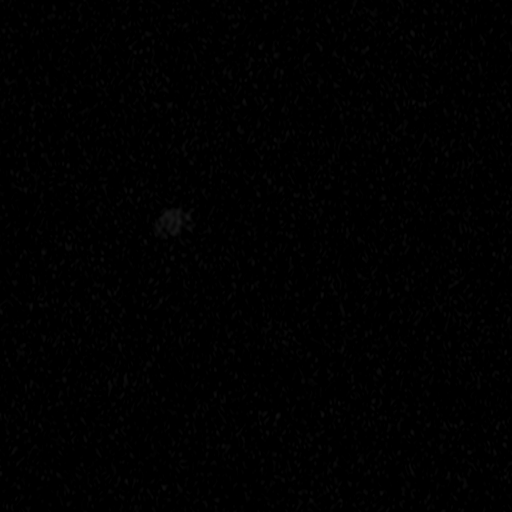
[im 10/40]
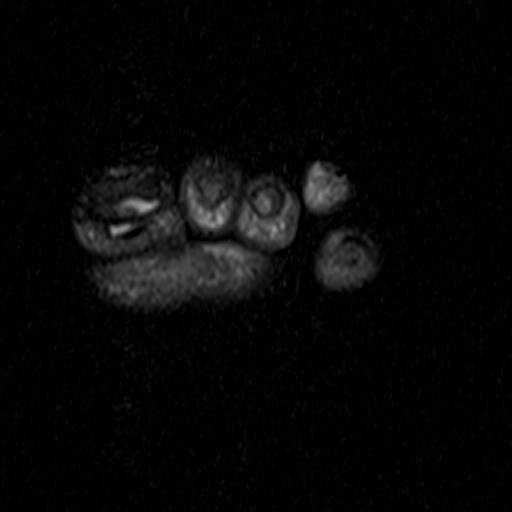
[im 20/40]
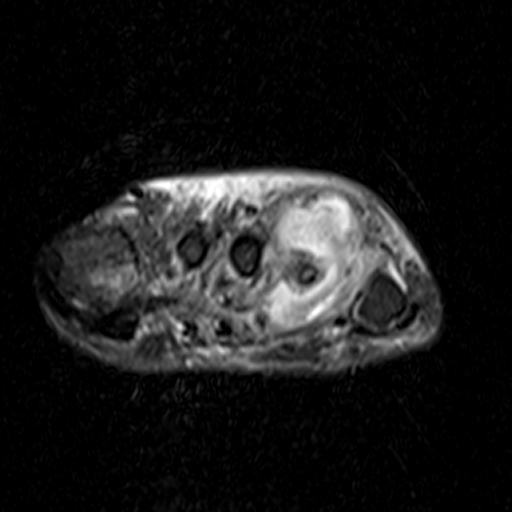
[im 30/40]
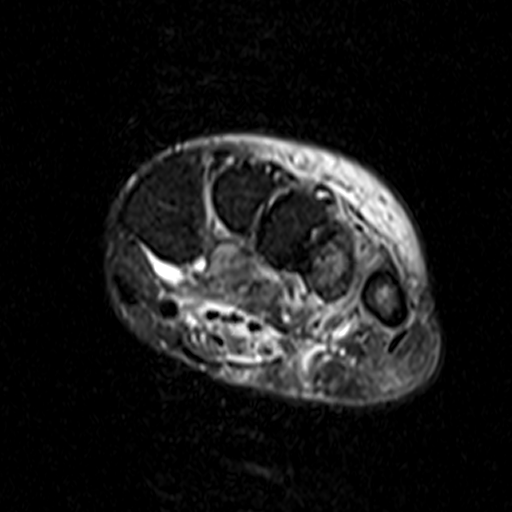
[im 40/40]
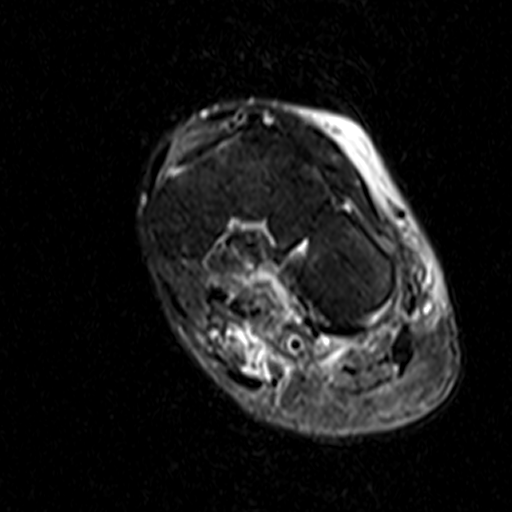

[Series 10: T1 · coronal · 3.0mm · 0.26mm/px · 5 of 40 slices shown (1 of 2)]
[im 1/40]
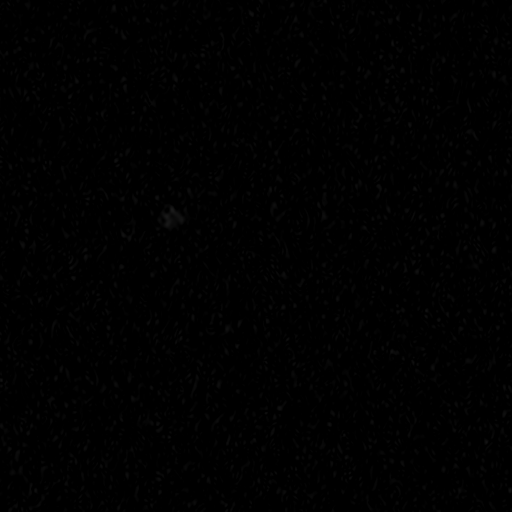
[im 10/40]
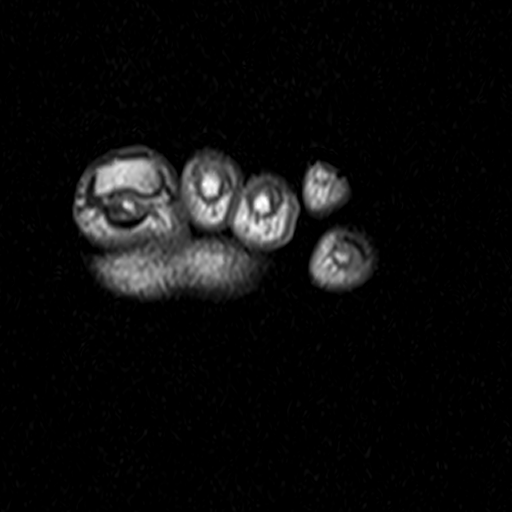
[im 20/40]
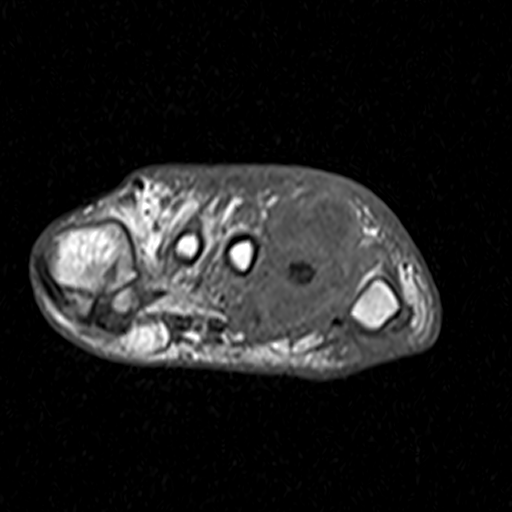
[im 30/40]
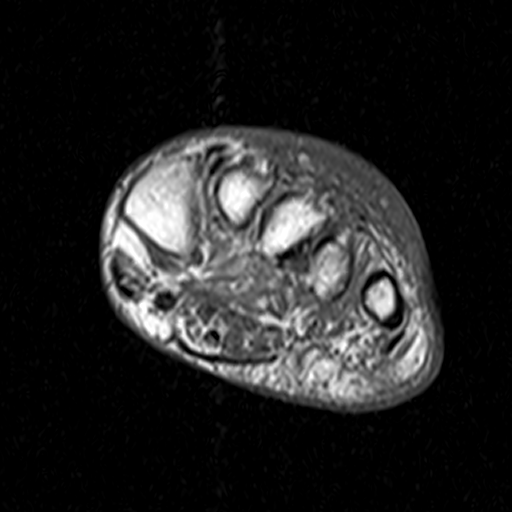
[im 40/40]
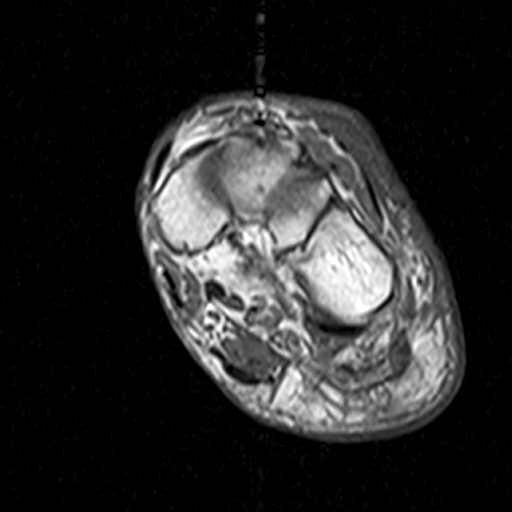

[Series 12: T1 · oblique · 3.0mm · 0.34mm/px · 3 of 24 slices shown (2 of 2)]
[im 1/24]
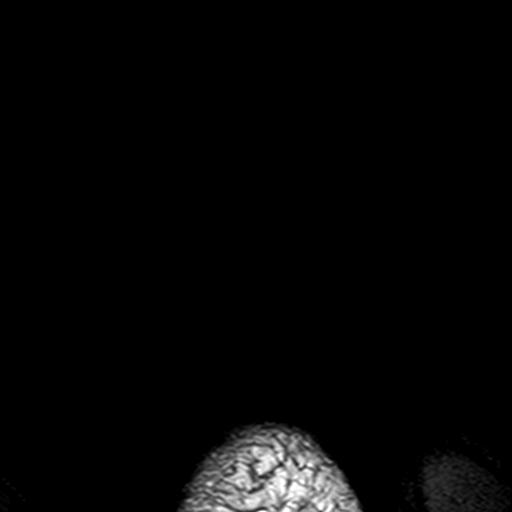
[im 12/24]
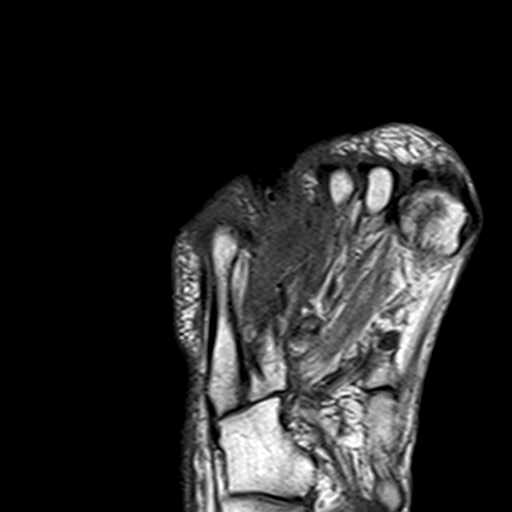
[im 24/24]
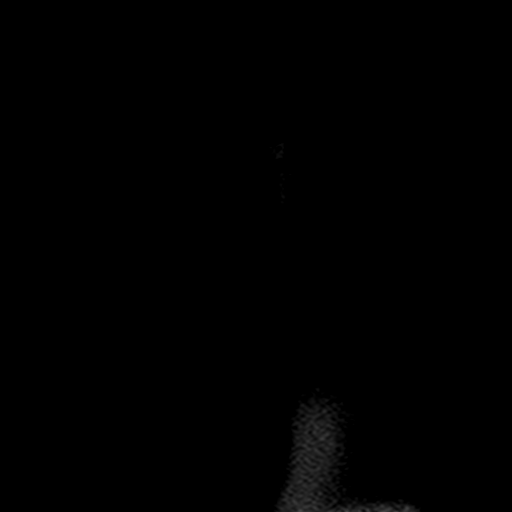

[19 of 40 positions shown; findings below may reference images not displayed]

FINDINGS: Bones/Joint/Cartilage

Bony destruction of the distal metaphysis and head of the fourth
metatarsal and of the base and proximal metaphysis of the proximal
phalanx fourth toe noted. Within or surrounding the joint there is a
3.7 by 2.3 by 2.9 cm (volume = 13 cm^3) fluid collection with
enhancing margins and drainage to the large plantar ulceration just
below the level of the joint. Surrounding soft tissue enhancement
compatible with infection/phlegmon.

There is edema signal in the head of the first metatarsal at the
articulation with the sesamoids, thought to likely be degenerative
rather than due to osteomyelitis, underlying spurring noted.

Ligaments

The Lisfranc ligament appears intact.

Muscles and Tendons

Low-level plantar muscular edema signal is likely neuropathic.

Soft tissues

Dorsal subcutaneous edema and enhancement along the forefoot
compatible with cellulitis. This partially extends into the toes,
most notably the second through fifth toes.
IMPRESSION: 1. Septic fourth MTP joint with bony destruction of the distal
fourth metatarsal and base of the fourth proximal phalanx; a 13
cubic cm abscess in the vicinity of the joint draining to the
plantar ulceration; and surrounding soft tissue phlegmon with dorsal
cellulitis extending into the second through fifth toes.

## 2023-03-20 ENCOUNTER — Ambulatory Visit: Payer: BLUE CROSS/BLUE SHIELD

## 2023-04-01 ENCOUNTER — Inpatient Hospital Stay (HOSPITAL_COMMUNITY): Payer: BC Managed Care – PPO

## 2023-04-01 ENCOUNTER — Inpatient Hospital Stay (HOSPITAL_COMMUNITY)
Admission: EM | Admit: 2023-04-01 | Discharge: 2023-04-05 | DRG: 854 | Disposition: A | Discharge status: Home / Self Care | Payer: BC Managed Care – PPO | Attending: Family Medicine | Admitting: Family Medicine

## 2023-04-01 ENCOUNTER — Emergency Department (HOSPITAL_COMMUNITY): Payer: BC Managed Care – PPO

## 2023-04-01 ENCOUNTER — Encounter (HOSPITAL_COMMUNITY): Payer: Self-pay

## 2023-04-01 ENCOUNTER — Other Ambulatory Visit: Payer: Self-pay

## 2023-04-01 DIAGNOSIS — L0291 Cutaneous abscess, unspecified: Secondary | ICD-10-CM | POA: Diagnosis not present

## 2023-04-01 DIAGNOSIS — M86179 Other acute osteomyelitis, unspecified ankle and foot: Secondary | ICD-10-CM | POA: Diagnosis present

## 2023-04-01 DIAGNOSIS — M89572 Osteolysis, left ankle and foot: Secondary | ICD-10-CM | POA: Diagnosis not present

## 2023-04-01 DIAGNOSIS — M869 Osteomyelitis, unspecified: Principal | ICD-10-CM

## 2023-04-01 DIAGNOSIS — L03116 Cellulitis of left lower limb: Secondary | ICD-10-CM | POA: Diagnosis not present

## 2023-04-01 DIAGNOSIS — R6520 Severe sepsis without septic shock: Secondary | ICD-10-CM | POA: Diagnosis present

## 2023-04-01 DIAGNOSIS — Z23 Encounter for immunization: Secondary | ICD-10-CM | POA: Diagnosis not present

## 2023-04-01 DIAGNOSIS — E66811 Obesity, class 1: Secondary | ICD-10-CM | POA: Diagnosis present

## 2023-04-01 DIAGNOSIS — Z79899 Other long term (current) drug therapy: Secondary | ICD-10-CM

## 2023-04-01 DIAGNOSIS — E869 Volume depletion, unspecified: Secondary | ICD-10-CM | POA: Diagnosis present

## 2023-04-01 DIAGNOSIS — M19072 Primary osteoarthritis, left ankle and foot: Secondary | ICD-10-CM | POA: Diagnosis present

## 2023-04-01 DIAGNOSIS — S91302A Unspecified open wound, left foot, initial encounter: Secondary | ICD-10-CM | POA: Diagnosis not present

## 2023-04-01 DIAGNOSIS — M86172 Other acute osteomyelitis, left ankle and foot: Secondary | ICD-10-CM | POA: Diagnosis present

## 2023-04-01 DIAGNOSIS — M00872 Arthritis due to other bacteria, left ankle and foot: Secondary | ICD-10-CM | POA: Diagnosis not present

## 2023-04-01 DIAGNOSIS — M009 Pyogenic arthritis, unspecified: Secondary | ICD-10-CM | POA: Diagnosis not present

## 2023-04-01 DIAGNOSIS — E872 Acidosis, unspecified: Secondary | ICD-10-CM | POA: Diagnosis not present

## 2023-04-01 DIAGNOSIS — M00879 Arthritis due to other bacteria, unspecified ankle and foot: Secondary | ICD-10-CM | POA: Diagnosis not present

## 2023-04-01 DIAGNOSIS — Z6832 Body mass index (BMI) 32.0-32.9, adult: Secondary | ICD-10-CM

## 2023-04-01 DIAGNOSIS — A419 Sepsis, unspecified organism: Principal | ICD-10-CM | POA: Diagnosis present

## 2023-04-01 DIAGNOSIS — L02612 Cutaneous abscess of left foot: Secondary | ICD-10-CM | POA: Diagnosis not present

## 2023-04-01 DIAGNOSIS — R600 Localized edema: Secondary | ICD-10-CM | POA: Diagnosis not present

## 2023-04-01 DIAGNOSIS — Z1611 Resistance to penicillins: Secondary | ICD-10-CM | POA: Diagnosis present

## 2023-04-01 LAB — RAPID HIV SCREEN (HIV 1/2 AB+AG)
HIV 1/2 Antibodies: NONREACTIVE
HIV-1 P24 Antigen - HIV24: NONREACTIVE

## 2023-04-01 LAB — COMPREHENSIVE METABOLIC PANEL
ALT: 43 U/L (ref 0–44)
AST: 31 U/L (ref 15–41)
Albumin: 3.2 g/dL — ABNORMAL LOW (ref 3.5–5.0)
Alkaline Phosphatase: 52 U/L (ref 38–126)
Anion gap: 9 (ref 5–15)
BUN: 12 mg/dL (ref 6–20)
CO2: 26 mmol/L (ref 22–32)
Calcium: 8.5 mg/dL — ABNORMAL LOW (ref 8.9–10.3)
Chloride: 103 mmol/L (ref 98–111)
Creatinine, Ser: 0.92 mg/dL (ref 0.61–1.24)
GFR, Estimated: >60 mL/min (ref >60)
Glucose, Bld: 120 mg/dL — ABNORMAL HIGH (ref 70–99)
Potassium: 3.8 mmol/L (ref 3.5–5.1)
Sodium: 138 mmol/L (ref 135–145)
Total Bilirubin: 0.4 mg/dL (ref 0.3–1.2)
Total Protein: 7.9 g/dL (ref 6.5–8.1)

## 2023-04-01 LAB — SURGICAL PCR SCREEN
MRSA, PCR: NEGATIVE
Staphylococcus aureus: POSITIVE — AB

## 2023-04-01 LAB — CBC WITH DIFFERENTIAL/PLATELET
Abs Immature Granulocytes: 0.01 10*3/uL (ref 0.00–0.07)
Basophils Absolute: 0 10*3/uL (ref 0.0–0.1)
Basophils Relative: 0 %
Eosinophils Absolute: 0 10*3/uL (ref 0.0–0.5)
Eosinophils Relative: 1 %
HCT: 42.6 % (ref 39.0–52.0)
Hemoglobin: 13 g/dL (ref 13.0–17.0)
Immature Granulocytes: 0 %
Lymphocytes Relative: 14 %
Lymphs Abs: 0.5 10*3/uL — ABNORMAL LOW (ref 0.7–4.0)
MCH: 28 pg (ref 26.0–34.0)
MCHC: 30.5 g/dL (ref 30.0–36.0)
MCV: 91.6 fL (ref 80.0–100.0)
Monocytes Absolute: 0.5 10*3/uL (ref 0.1–1.0)
Monocytes Relative: 12 %
Neutro Abs: 2.8 10*3/uL (ref 1.7–7.7)
Neutrophils Relative %: 73 %
Platelets: 301 10*3/uL (ref 150–400)
RBC: 4.65 MIL/uL (ref 4.22–5.81)
RDW: 11.9 % (ref 11.5–15.5)
WBC: 3.9 10*3/uL — ABNORMAL LOW (ref 4.0–10.5)
nRBC: 0 % (ref 0.0–0.2)

## 2023-04-01 LAB — LACTIC ACID, PLASMA
Lactic Acid, Venous: 2.2 mmol/L (ref 0.5–1.9)
Lactic Acid, Venous: 1.5 mmol/L (ref 0.5–1.9)

## 2023-04-01 MED ORDER — LACTATED RINGERS IV BOLUS
1000.0000 mL | Freq: Once | INTRAVENOUS | Status: AC
  Administered 2023-04-01: 1000 mL via INTRAVENOUS

## 2023-04-01 MED ORDER — ONDANSETRON HCL 4 MG PO TABS: 4.0000 mg | ORAL_TABLET | Freq: Four times a day (QID) | ORAL | Status: DC | PRN

## 2023-04-01 MED ORDER — SODIUM CHLORIDE 0.9 % IV SOLN: 2.0000 g | Freq: Once | INTRAVENOUS | Status: DC

## 2023-04-01 MED ORDER — PIPERACILLIN-TAZOBACTAM 3.375 G IVPB
3.3750 g | Freq: Three times a day (TID) | INTRAVENOUS | Status: DC
  Administered 2023-04-01 (×2): 3.375 g via INTRAVENOUS
  Filled 2023-04-01 (×2): qty 50

## 2023-04-01 MED ORDER — ACETAMINOPHEN 650 MG RE SUPP: 650.0000 mg | Freq: Four times a day (QID) | RECTAL | Status: DC | PRN

## 2023-04-01 MED ORDER — CHLORHEXIDINE GLUCONATE 4 % EX SOLN: 60.0000 mL | Freq: Once | CUTANEOUS | Status: DC

## 2023-04-01 MED ORDER — MUPIROCIN 2 % EX OINT
1.0000 | TOPICAL_OINTMENT | Freq: Two times a day (BID) | CUTANEOUS | Status: DC
  Administered 2023-04-01 – 2023-04-05 (×7): 1 via NASAL
  Filled 2023-04-01 (×2): qty 22

## 2023-04-01 MED ORDER — VANCOMYCIN HCL 2000 MG/400ML IV SOLN
2000.0000 mg | Freq: Once | INTRAVENOUS | Status: AC
  Administered 2023-04-01: 2000 mg via INTRAVENOUS
  Filled 2023-04-01: qty 400

## 2023-04-01 MED ORDER — ORAL CARE MOUTH RINSE: 15.0000 mL | OROMUCOSAL | Status: DC | PRN

## 2023-04-01 MED ORDER — INFLUENZA VIRUS VACC SPLIT PF (FLUZONE) 0.5 ML IM SUSY
0.5000 mL | PREFILLED_SYRINGE | INTRAMUSCULAR | Status: AC
  Administered 2023-04-04: 0.5 mL via INTRAMUSCULAR
  Filled 2023-04-01: qty 0.5

## 2023-04-01 MED ORDER — CHLORHEXIDINE GLUCONATE CLOTH 2 % EX PADS
6.0000 | MEDICATED_PAD | Freq: Every day | CUTANEOUS | Status: DC
  Administered 2023-04-01 – 2023-04-05 (×4): 6 via TOPICAL

## 2023-04-01 MED ORDER — HEPARIN SODIUM (PORCINE) 5000 UNIT/ML IJ SOLN
5000.0000 [IU] | Freq: Three times a day (TID) | INTRAMUSCULAR | Status: DC
  Administered 2023-04-01 – 2023-04-02 (×2): 5000 [IU] via SUBCUTANEOUS
  Filled 2023-04-01 (×2): qty 1

## 2023-04-01 MED ORDER — ACETAMINOPHEN 325 MG PO TABS
650.0000 mg | ORAL_TABLET | Freq: Four times a day (QID) | ORAL | Status: DC | PRN
  Filled 2023-04-01: qty 2

## 2023-04-01 MED ORDER — GADOBUTROL 1 MMOL/ML IV SOLN
10.0000 mL | Freq: Once | INTRAVENOUS | Status: AC | PRN
  Administered 2023-04-01: 10 mL via INTRAVENOUS

## 2023-04-01 MED ORDER — VANCOMYCIN HCL IN DEXTROSE 1-5 GM/200ML-% IV SOLN
1000.0000 mg | Freq: Two times a day (BID) | INTRAVENOUS | Status: DC
  Administered 2023-04-01 – 2023-04-04 (×5): 1000 mg via INTRAVENOUS
  Filled 2023-04-01 (×6): qty 200

## 2023-04-01 MED ORDER — LACTATED RINGERS IV SOLN: INTRAVENOUS | Status: DC

## 2023-04-01 MED ORDER — SODIUM CHLORIDE 0.9 % IV SOLN
2.0000 g | INTRAVENOUS | Status: DC
  Administered 2023-04-01: 2 g via INTRAVENOUS
  Filled 2023-04-01: qty 20

## 2023-04-01 MED ORDER — ONDANSETRON HCL 4 MG/2ML IJ SOLN: 4.0000 mg | Freq: Four times a day (QID) | INTRAMUSCULAR | Status: DC | PRN

## 2023-04-01 NOTE — H&P (Signed)
History and Physical    Patient: Jerome Sutton QVZ:[id number] DOB: [birth year] DOA: 04/01/2023 DOS: the patient was seen and examined on 04/01/2023 PCP: Patient, No Pcp Per  Patient coming from: Home  Chief Complaint:  Chief Complaint  Patient presents with   Wound Check   HPI: Jerome Sutton is a 42 year old male with no documented chronic medical problems presenting with a 3-week history of left plantar foot wound and swelling.  The patient states that he noted a small wound on the lateral aspect of the plantar surface of his left foot about 3 weeks prior to this admission.  He had noted some swelling at that time.  He stated that he had put some type of OTC salve on his plantar wound.  He has noted some on and off swelling for the past 3 weeks, but states that the wound had not improved very much.  He began noticing some more drainage from his plantar surface on 03/29/2023.  In addition, he noted some leakage from the dorsal wound on his left foot fifth metatarsal area on 03/31/2023.  Because of continued swelling and drainage, he presented for further evaluation and treatment. Patient denies fevers, chills, headache, chest pain, dyspnea, nausea, vomiting, diarrhea, abdominal pain, dysuria, hematuria, hematochezia, and melena.  Notably, the patient had a similar wound back in January 2019.  MRI on 07/04/2017 showed septic arthritis of the left fourth MTP joint with bone destruction of the fourth distal metatarsal and fourth proximal phalanx.  At that time, the patient was taken to the OR by Dr. Fuller Canada where the patient underwent left fourth MTP joint resection and resection of the fourth toe.  In the ED, the patient was afebrile but tachycardic.  He is hemodynamically stable.  Oxygen saturation is 100% room air.  WBC 3.9, hemoglobin 13.0, platelets 301.  Sodium 130, potassium 3.8, bicarbonate 26, serum creatinine 0.92.  AST 31, ALT 43, alkaline phosphatase 52, total bilirubin 0.4.   Lactic acid 2.2. CT foot showed septic arthritis of the fifth MTP joint with osteolysis of the distal fifth metatarsal shaft and base of the fifth phalanx.  There was underlying abscess overlying the fifth MTP joint.  There is an additional rim-enhancing fluid collection on the dorsal midfoot. EDP contacted Ortho, Dr. Fuller Canada who agreed to consult on the patient.  MRI was requested of the left foot.   Review of Systems: As mentioned in the history of present illness. All other systems reviewed and are negative. History reviewed. No pertinent past medical history. Past Surgical History:  Procedure Laterality Date   BACK SURGERY     INCISION AND DRAINAGE ABSCESS Left 07/04/2017   Procedure: INCISION AND DRAINAGE ABSCESS LEFT FOOT AND FOURTH METATARSOPHALANGEAL BONE RESECTION AND MANUAL MANIPULATION OF SECOND AND THIRD TOES;  Surgeon: Vickki Hearing, MD;  Location: [facility];  Service: Orthopedics;  Laterality: Left;   LUMBAR LAMINECTOMY/DECOMPRESSION MICRODISCECTOMY  02/18/2012   Procedure: LUMBAR LAMINECTOMY/DECOMPRESSION MICRODISCECTOMY 1 LEVEL;  Surgeon: Mariam Dollar, MD;  Location: [facility];  Service: Neurosurgery;  Laterality: Bilateral;  Bilateral Lumbar Laminectomy and diskectomy   Social History:  reports that he has never smoked. He has never used smokeless tobacco. He reports that he does not drink alcohol and does not use drugs.  No Known Allergies  History reviewed. No pertinent family history.  Prior to Admission medications   Not on File    Physical Exam: Vitals:   04/01/23 0815 04/01/23 0900 04/01/23 1000 04/01/23 1100  BP: (!) 166/88 (!) 143/76 (!) 156/94 129/64  Pulse: 91 86 79 90  Resp: (!) 21 13 16 16   Temp:      TempSrc:      SpO2: 96% 98% 98% 96%  Weight:      Height:       GENERAL:  A&O x 3, NAD, well developed, cooperative, follows commands HEENT: [place]/AT, No thrush, No icterus, No oral ulcers Neck:  No neck mass, No meningismus, soft, supple CV:  RRR, no S3, no S4, no rub, no JVD Lungs:  CTA, no wheeze, no rhonchi, good air movement Abd: soft/NT +BS, nondistended Ext: see pics below.  +DP and PT pulses palpable Neuro:  CN II-XII intact, strength 4/5 in RUE, RLE, strength 4/5 LUE, LLE; sensation intact bilateral; no dysmetria; babinski equivocal         Data Reviewed: Data reviewed above in history  Assessment and Plan: Acute osteomyelitis and septic arthritis of the left foot -MRI left foot -ESR -CRP -Continue vancomycin -Start ceftriaxone -Orthopedic surgery, Dr. Fuller Canada consulted -Start IV fluids  Lactic acidosis -Secondary to volume depletion and infectious process -Continue IV fluids    Advance Care Planning: FULL  Consults: ortho--[place]  Family Communication: none  Severity of Illness: The appropriate patient status for this patient is INPATIENT. Inpatient status is judged to be reasonable and necessary in order to provide the required intensity of service to ensure the patient's safety. The patient's presenting symptoms, physical exam findings, and initial radiographic and laboratory data in the context of their chronic comorbidities is felt to place them at high risk for further clinical deterioration. Furthermore, it is not anticipated that the patient will be medically stable for discharge from the hospital within 2 midnights of admission.   * I certify that at the point of admission it is my clinical judgment that the patient will require inpatient hospital care spanning beyond 2 midnights from the point of admission due to high intensity of service, high risk for further deterioration and high frequency of surveillance required.*  Author: Catarina Hartshorn, MD 04/01/2023 11:32 AM  For on call review [email or web address].

## 2023-04-01 NOTE — Plan of Care (Signed)

## 2023-04-01 NOTE — ED Notes (Signed)
CRITICAL VALUE STICKER  CRITICAL VALUE: lactic acid 2.2  DATE & TIME NOTIFIED: 0910  MD NOTIFIED: Jearld Fenton  TIME OF NOTIFICATION: [phone]

## 2023-04-01 NOTE — ED Provider Notes (Signed)
[place] EMERGENCY DEPARTMENT AT [facility] Provider Note   CSN: [id number] Arrival date & time: 04/01/23  7829     History  Chief Complaint  Patient presents with   Wound Check    Jerome Sutton is a 42 y.o. male with PMH as listed below who presents with left foot wound x 2-3 weeks and presents today due to increased bleeding. Per chart review, pt has h/o osteomyelitis/abscess of L foot s/p I&D and bone resection in 06/2017. Has not had any problems with the foot since that time until 3 weeks ago. Since that time has had intermittent purulent drainage and bleeding from wound on top and bottom of foot in same region as before. This is how it started as well, except he does not currently have any pain in the foot, which he had before. Denies f/c, N/V, N/T, systemic sxs.    History reviewed. No pertinent past medical history.     Home Medications Prior to Admission medications   Not on File      Allergies    Patient has no known allergies.    Review of Systems   Review of Systems A 10 point review of systems was performed and is negative unless otherwise reported in HPI.  Physical Exam Updated Vital Signs BP (!) 156/94   Pulse 79   Temp 97.9 F (36.6 C) (Oral)   Resp 16   Ht 5\' 7"  (1.702 m)   Wt 94.3 kg   SpO2 98%   BMI 32.58 kg/m  Physical Exam General: Normal appearing male, lying in bed.  HEENT: Sclera anicteric, MMM, trachea midline.  Cardiology: Regular tachycardic rate, no murmurs/rubs/gallops.  Resp: Normal respiratory rate and effort. CTAB, no wheezes, rhonchi, crackles.  Abd: Soft, non-tender, non-distended. No rebound tenderness or guarding.  GU: Deferred. MSK: S/p previous partial amputation of L metatarsals. Open wound to dorsal and plantar surface fo L foot with no TTP or erythema, please see images below. No purulent drainage currently. No crepitus palpable. Strong DP/PT pulses bilaterally, warm foot. No peripheral edema or signs of  trauma. Skin: warm, dry.  Neuro: A&Ox4, CNs II-XII grossly intact. MAEs. Sensation grossly intact.  Psych: Normal mood and affect.            ED Results / Procedures / Treatments   Labs (all labs ordered are listed, but only abnormal results are displayed) Labs Reviewed  CBC WITH DIFFERENTIAL/PLATELET - Abnormal; Notable for the following components:      Result Value   WBC 3.9 (*)    Lymphs Abs 0.5 (*)    All other components within normal limits  COMPREHENSIVE METABOLIC PANEL - Abnormal; Notable for the following components:   Glucose, Bld 120 (*)    Calcium 8.5 (*)    Albumin 3.2 (*)    All other components within normal limits  LACTIC ACID, PLASMA - Abnormal; Notable for the following components:   Lactic Acid, Venous 2.2 (*)    All other components within normal limits  CULTURE, BLOOD (ROUTINE X 2)  CULTURE, BLOOD (ROUTINE X 2)  LACTIC ACID, PLASMA  RAPID HIV SCREEN (HIV 1/2 AB+AG)    EKG None  Radiology CT Foot Left Wo Contrast  Result Date: 04/01/2023 CLINICAL DATA:  Osteomyelitis suspected, foot, xray done h/ osteomyelitis, r/o nec fasc. EXAM: CT OF THE LEFT FOOT WITHOUT CONTRAST TECHNIQUE: Multidetector CT imaging of the left foot was performed according to the standard protocol. Multiplanar CT image reconstructions were also generated.  RADIATION DOSE REDUCTION: This exam was performed according to the departmental dose-optimization program which includes automated exposure control, adjustment of the mA and/or kV according to patient size and/or use of iterative reconstruction technique. COMPARISON:  MRI left foot dated July 04, 2017. FINDINGS: Bones/Joint/Cartilage There has been complete destruction of the fifth MTP joint with regions of cortical indistinctness and osteolysis involving the remnant fifth distal metatarsal metaphyseal shaft and base of the fifth proximal phalanx. Punctate curvilinear foci of mineralization within the region of the fifth MTP  articulation. The remnant base of fifth proximal phalanx is subluxed dorsally relative to the remnant fifth distal metatarsal shaft. Redemonstration of sequela of prior septic arthritis of the fourth MTP joint with associated chronic appearing bony destruction and possible postsurgical changes of the distal fourth metatarsal and base of fourth proximal phalanx. No definite evidence of new osteolysis or periostitis involving the remnant fourth metatarsal shaft and fourth proximal phalanx. There is pronounced dorsiflexion noted at the second and third MTP joints. No additional areas of discernible osteolysis or periostitis identified. Punctate ossicles at the base of the proximal cuboid, may relate to prior trauma, however, the more dominant sized ossicle may represent an os peroneum. The visualized hindfoot and midfoot articulations are intact. Ligaments Suboptimally assessed by CT. Soft tissues There is a plantar soft tissue wound of the forefoot at the level of the fifth MTP joint which tracks deep to communicate with a peripherally enhancing fluid collection, with extension into the region of the bony destructive changes of the fifth MTP joint, as described above. This collection measures 2.6 cm transverse x 2.7 cm AP x 1.6 cm craniocaudal (series 7, image 96 and series 9, images 123 and 124). There is surrounding edema and infiltration of the subcutaneous fat with overlying cutaneous thickening. No soft tissue gas. Cutaneous thickening is noted involving the soft tissues of the lateral midfoot, overlying the base of fifth metatarsal. There is underlying subcutaneous edema and ill-defined fluid with a loculated irregular peripherally enhancing fluid collection measuring 1.1 cm transverse x 0.6 cm AP x 0.6 cm craniocaudal (series 7, image 155 and series 9, image 126). There is a focal region of cutaneous thickening overlying the head of third metatarsal with mild surrounding edema. No discrete fluid collection is  identified in this region. There is dorsal and lateral subcutaneous edema of the midfoot and forefoot. IMPRESSION: 1. Septic arthritis of the fifth MTP joint with osteolysis of the fifth distal metatarsal shaft and base of fifth proximal phalanx, concerning for osteomyelitis. There is an underlying abscess, with measurements as above, which communicates with a plantar soft tissue wound overlying the fifth MTP joint and surrounding cellulitis. 2. Additional rim enhancing fluid collection, favored to represent an abscess, involving the soft tissues of the lateral midfoot, overlying the base of fifth metatarsal. Cellulitis of the dorsal and lateral midfoot through forefoot. 3. Focal cutaneous thickening overlying the head of the third metatarsal with mild surrounding edema could represent cellulitis. 4. Sequela of prior septic arthritis of the fourth MTP joint with associated chronic appearing bony destruction and possible postoperative changes of the distal fourth metatarsal and base of the fourth proximal phalanx without definite evidence of new osteolysis or periostitis. Electronically Signed   By: Hart Robinsons M.D.   On: 04/01/2023 09:56    Procedures .Critical Care  Performed by: Loetta Rough, MD Authorized by: Loetta Rough, MD   Critical care provider statement:    Critical care time (minutes):  35  Critical care was necessary to treat or prevent imminent or life-threatening deterioration of the following conditions:  Sepsis   Critical care was time spent personally by me on the following activities:  Development of treatment plan with patient or surrogate, discussions with consultants, evaluation of patient's response to treatment, examination of patient, ordering and review of laboratory studies, ordering and review of radiographic studies, ordering and performing treatments and interventions, pulse oximetry, re-evaluation of patient's condition, review of old charts and obtaining history  from patient or surrogate   Care discussed with: admitting provider       Medications Ordered in ED Medications  vancomycin (VANCOREADY) IVPB 2000 mg/400 mL (2,000 mg Intravenous New Bag/Given 04/01/23 0940)  piperacillin-tazobactam (ZOSYN) IVPB 3.375 g (3.375 g Intravenous New Bag/Given 04/01/23 0948)  lactated ringers bolus 1,000 mL (0 mLs Intravenous Stopped 04/01/23 0930)    ED Course/ Medical Decision Making/ A&P                          Medical Decision Making Amount and/or Complexity of Data Reviewed Labs: ordered. Decision-making details documented in ED Course. Radiology: ordered. Decision-making details documented in ED Course.  Risk Prescription drug management. Decision regarding hospitalization.    This patient presents to the ED for concern of L foot wound, this involves an extensive number of treatment options, and is a complaint that carries with it a high risk of complications and morbidity.  I considered the following differential and admission for this acute, potentially life threatening condition.   MDM:    Pt presents tachycardic into 120s, afebrile. Must consider bacteremia/SIRS, will obtain blood cultures/lactate. However he is very well-appearing and non-toxic on arrival. Consider L foot cellulitis, abscess, osteomyelitis, or nec fasc.  Wound appears to have developing slowly over the last 3 weeks however patient presents tachycardic and states that it seems to have gotten worse recently, will obtain CT to rule out neck Fasc and further evaluate wound.  Will give liter of fluid for sinus tachycardia.  Clinical Course as of 04/01/23 1115  Mon Apr 01, 2023  0913 WBC(!): 3.9 WBC <4, HR initially 107 bpm, and lactate 2.2, Pt meets severe sepsis criteria.  [HN]  1017 CT Foot Left Wo Contrast 1. Septic arthritis of the fifth MTP joint with osteolysis of the fifth distal metatarsal shaft and base of fifth proximal phalanx, concerning for osteomyelitis. There is  an underlying abscess, with measurements as above, which communicates with a plantar soft tissue wound overlying the fifth MTP joint and surrounding cellulitis. 2. Additional rim enhancing fluid collection, favored to represent an abscess, involving the soft tissues of the lateral midfoot, overlying the base of fifth metatarsal. Cellulitis of the dorsal and lateral midfoot through forefoot. 3. Focal cutaneous thickening overlying the head of the third metatarsal with mild surrounding edema could represent cellulitis. 4. Sequela of prior septic arthritis of the fourth MTP joint with associated chronic appearing bony destruction and possible postoperative changes of the distal fourth metatarsal and base of the fourth proximal phalanx without definite evidence of new osteolysis or periostitis.   [HN]  1017 Consulting to ortho [HN]  1020 Pt with history of recurrent osteomyelitis with seemingly no other risk factors, does have mild leukopenia and low lymphocytes, will screen for HIV.  [HN]  1111 D/w Dr. Romeo Apple who recommends MRI and will see the patient. Pt admitted to Dr. Arbutus Leas with hospitalist.  [HN]    Clinical Course User Index [HN] Jearld Fenton,  Vanessa Barbara, MD    Labs: I Ordered, and personally interpreted labs.  The pertinent results include: Those listed above  Imaging Studies ordered: I ordered imaging studies including left foot CT I independently visualized and interpreted imaging. I agree with the radiologist interpretation  Additional history obtained from chart review.    Cardiac Monitoring: The patient was maintained on a cardiac monitor.  I personally viewed and interpreted the cardiac monitored which showed an underlying rhythm of: Sinus tachycardia  Reevaluation: After the interventions noted above, I reevaluated the patient and found that they have :improved  Social Determinants of Health:  lives independently  Disposition:  Admitted to medicine w/ ortho following. MRI  pending.  Co morbidities that complicate the patient evaluation History reviewed. No pertinent past medical history.   Medicines Meds ordered this encounter  Medications   lactated ringers bolus 1,000 mL   DISCONTD: cefTRIAXone (ROCEPHIN) 2 g in sodium chloride 0.9 % 100 mL IVPB    Order Specific Question:   Antibiotic Indication:    Answer:   Bacteremia    Order Specific Question:   Other Indication:    Answer:   skin/soft tissue source   vancomycin (VANCOREADY) IVPB 2000 mg/400 mL    Order Specific Question:   Indication:    Answer:   Cellulitis   piperacillin-tazobactam (ZOSYN) IVPB 3.375 g    Order Specific Question:   Antibiotic Indication:    Answer:   Cellulitis    I have reviewed the patients home medicines and have made adjustments as needed  Problem List / ED Course: Problem List Items Addressed This Visit       Musculoskeletal and Integument   Osteomyelitis of left foot (HCC) - Primary   Relevant Medications   vancomycin (VANCOREADY) IVPB 2000 mg/400 mL                This note was created using dictation software, which may contain spelling or grammatical errors.    Loetta Rough, MD 04/01/23 [phone]

## 2023-04-01 NOTE — ED Triage Notes (Signed)
Pt arrives via POV with c/o left foot wound x 2-3 weeks and presents today due to increased bleeding. States 5 years ago he had surgery on the same foot for an infection.

## 2023-04-01 NOTE — ED Notes (Signed)
Patient returned from MRI.

## 2023-04-01 NOTE — Hospital Course (Signed)
42 year old male with no documented chronic medical problems presenting with a 3-week history of left plantar foot wound and swelling.  The patient states that he noted a small wound on the lateral aspect of the plantar surface of his left foot about 3 weeks prior to this admission.  He had noted some swelling at that time.  He stated that he had put some type of OTC salve on his plantar wound.  He has noted some on and off swelling for the past 3 weeks, but states that the wound had not improved very much.  He began noticing some more drainage from his plantar surface on 03/29/2023.  In addition, he noted some leakage from the dorsal wound on his left foot fifth metatarsal area on 03/31/2023.  Because of continued swelling and drainage, he presented for further evaluation and treatment. Patient denies fevers, chills, headache, chest pain, dyspnea, nausea, vomiting, diarrhea, abdominal pain, dysuria, hematuria, hematochezia, and melena.  Notably, the patient had a similar wound back in January 2019.  MRI on 07/04/2017 showed septic arthritis of the left fourth MTP joint with bone destruction of the fourth distal metatarsal and fourth proximal phalanx.  At that time, the patient was taken to the OR by Dr. Fuller Canada where the patient underwent left fourth MTP joint resection and resection of the fourth toe.  In the ED, the patient was afebrile but tachycardic.  He is hemodynamically stable.  Oxygen saturation is 100% room air.  WBC 3.9, hemoglobin 13.0, platelets 301.  Sodium 130, potassium 3.8, bicarbonate 26, serum creatinine 0.92.  AST 31, ALT 43, alkaline phosphatase 52, total bilirubin 0.4.  Lactic acid 2.2. CT foot showed septic arthritis of the fifth MTP joint with osteolysis of the distal fifth metatarsal shaft and base of the fifth phalanx.  There was underlying abscess overlying the fifth MTP joint.  There is an additional rim-enhancing fluid collection on the dorsal midfoot. EDP contacted Ortho,  Dr. Fuller Canada who agreed to consult on the patient.  MRI was requested of the left foot.

## 2023-04-01 NOTE — Progress Notes (Signed)
Pharmacy Antibiotic Note  Jerome Sutton is a 42 y.o. male admitted on 04/01/2023 with  foot wound .  Pharmacy has been consulted for vancomycin and zosyn dosing.  Patient currently afebrile, wbc 3. Renal function normal.   Plan:  Vancomycin 1000 mg IV Q 12 hrs. Goal AUC 400-550. Expected AUC: 477 SCr used: 0.9 Zosyn 3.375g IV q8 hours  Height: 5\' 7"  (170.2 cm) Weight: 94.3 kg (208 lb) IBW/kg (Calculated) : 66.1  Temp (24hrs), Avg:97.9 F (36.6 C), Min:97.9 F (36.6 C), Max:97.9 F (36.6 C)  Recent Labs  Lab 04/01/23 0843  WBC 3.9*  CREATININE 0.92  LATICACIDVEN 2.2*    Estimated Creatinine Clearance: 116.8 mL/min (by C-G formula based on SCr of 0.92 mg/dL).    No Known Allergies  Thank you for allowing pharmacy to be a part of this patient's care.  Sheppard Coil PharmD., BCPS Clinical Pharmacist 04/01/2023 11:32 AM

## 2023-04-02 ENCOUNTER — Encounter (HOSPITAL_COMMUNITY): Payer: Self-pay | Admitting: Internal Medicine

## 2023-04-02 ENCOUNTER — Other Ambulatory Visit: Payer: Self-pay

## 2023-04-02 ENCOUNTER — Inpatient Hospital Stay (HOSPITAL_COMMUNITY): Payer: BC Managed Care – PPO | Admitting: Anesthesiology

## 2023-04-02 ENCOUNTER — Encounter (HOSPITAL_COMMUNITY)
Admission: EM | Disposition: A | Discharge status: Home / Self Care | Payer: Self-pay | Source: Home / Self Care | Attending: Family Medicine

## 2023-04-02 DIAGNOSIS — M86172 Other acute osteomyelitis, left ankle and foot: Secondary | ICD-10-CM | POA: Diagnosis not present

## 2023-04-02 DIAGNOSIS — L02612 Cutaneous abscess of left foot: Secondary | ICD-10-CM | POA: Diagnosis not present

## 2023-04-02 DIAGNOSIS — M869 Osteomyelitis, unspecified: Secondary | ICD-10-CM | POA: Diagnosis not present

## 2023-04-02 DIAGNOSIS — M86179 Other acute osteomyelitis, unspecified ankle and foot: Secondary | ICD-10-CM | POA: Diagnosis not present

## 2023-04-02 DIAGNOSIS — M009 Pyogenic arthritis, unspecified: Secondary | ICD-10-CM | POA: Diagnosis not present

## 2023-04-02 HISTORY — PX: INCISION AND DRAINAGE ABSCESS: SHX5864

## 2023-04-02 LAB — BASIC METABOLIC PANEL
Anion gap: 9 (ref 5–15)
BUN: 9 mg/dL (ref 6–20)
CO2: 26 mmol/L (ref 22–32)
Calcium: 8.5 mg/dL — ABNORMAL LOW (ref 8.9–10.3)
Chloride: 102 mmol/L (ref 98–111)
Creatinine, Ser: 0.86 mg/dL (ref 0.61–1.24)
GFR, Estimated: >60 mL/min (ref >60)
Glucose, Bld: 106 mg/dL — ABNORMAL HIGH (ref 70–99)
Potassium: 3.6 mmol/L (ref 3.5–5.1)
Sodium: 137 mmol/L (ref 135–145)

## 2023-04-02 LAB — CBC
HCT: 40.1 % (ref 39.0–52.0)
Hemoglobin: 12.3 g/dL — ABNORMAL LOW (ref 13.0–17.0)
MCH: 27.9 pg (ref 26.0–34.0)
MCHC: 30.7 g/dL (ref 30.0–36.0)
MCV: 90.9 fL (ref 80.0–100.0)
Platelets: 305 10*3/uL (ref 150–400)
RBC: 4.41 MIL/uL (ref 4.22–5.81)
RDW: 11.8 % (ref 11.5–15.5)
WBC: 4.3 10*3/uL (ref 4.0–10.5)
nRBC: 0 % (ref 0.0–0.2)

## 2023-04-02 LAB — C-REACTIVE PROTEIN: CRP: 0.7 mg/dL (ref <1.0)

## 2023-04-02 LAB — SEDIMENTATION RATE: Sed Rate: 48 mm/h — ABNORMAL HIGH (ref 0–16)

## 2023-04-02 LAB — GLUCOSE, CAPILLARY: Glucose-Capillary: 119 mg/dL — ABNORMAL HIGH (ref 70–99)

## 2023-04-02 SURGERY — INCISION AND DRAINAGE, ABSCESS
Anesthesia: General | Site: Foot | Laterality: Left

## 2023-04-02 MED ORDER — TRAMADOL HCL 50 MG PO TABS
50.0000 mg | ORAL_TABLET | Freq: Four times a day (QID) | ORAL | Status: DC
  Administered 2023-04-02 – 2023-04-04 (×7): 50 mg via ORAL
  Filled 2023-04-02 (×10): qty 1

## 2023-04-02 MED ORDER — VANCOMYCIN HCL 1000 MG IV SOLR
INTRAVENOUS | Status: DC | PRN
  Administered 2023-04-02: 1000 mg via INTRAVENOUS

## 2023-04-02 MED ORDER — SODIUM CHLORIDE 0.9 % IR SOLN
Status: DC | PRN
  Administered 2023-04-02: 1000 mL

## 2023-04-02 MED ORDER — LACTATED RINGERS IV SOLN
INTRAVENOUS | Status: DC | PRN
Start: 2023-04-02 — End: 2023-04-02

## 2023-04-02 MED ORDER — ONDANSETRON HCL 4 MG/2ML IJ SOLN: 4.0000 mg | Freq: Once | INTRAMUSCULAR | Status: DC | PRN

## 2023-04-02 MED ORDER — HYDROCODONE-ACETAMINOPHEN 5-325 MG PO TABS: 1.0000 | ORAL_TABLET | ORAL | Status: DC | PRN

## 2023-04-02 MED ORDER — PROPOFOL 10 MG/ML IV BOLUS
INTRAVENOUS | Status: DC | PRN
  Administered 2023-04-02: 250 mg via INTRAVENOUS

## 2023-04-02 MED ORDER — MIDAZOLAM HCL 5 MG/5ML IJ SOLN
INTRAMUSCULAR | Status: DC | PRN
  Administered 2023-04-02: 2 mg via INTRAVENOUS

## 2023-04-02 MED ORDER — ACETAMINOPHEN 500 MG PO TABS
500.0000 mg | ORAL_TABLET | Freq: Once | ORAL | Status: AC
  Administered 2023-04-02: 500 mg via ORAL
  Filled 2023-04-02: qty 1

## 2023-04-02 MED ORDER — METOCLOPRAMIDE HCL 10 MG PO TABS: 5.0000 mg | ORAL_TABLET | Freq: Three times a day (TID) | ORAL | Status: DC | PRN

## 2023-04-02 MED ORDER — MIDAZOLAM HCL 2 MG/2ML IJ SOLN
INTRAMUSCULAR | Status: AC
  Filled 2023-04-02: qty 2

## 2023-04-02 MED ORDER — METRONIDAZOLE 500 MG PO TABS
500.0000 mg | ORAL_TABLET | Freq: Two times a day (BID) | ORAL | Status: DC
  Administered 2023-04-02 – 2023-04-05 (×6): 500 mg via ORAL
  Filled 2023-04-02 (×6): qty 1

## 2023-04-02 MED ORDER — FENTANYL CITRATE (PF) 100 MCG/2ML IJ SOLN
INTRAMUSCULAR | Status: AC
  Filled 2023-04-02: qty 2

## 2023-04-02 MED ORDER — CHLORHEXIDINE GLUCONATE 0.12 % MT SOLN
15.0000 mL | Freq: Once | OROMUCOSAL | Status: AC
  Administered 2023-04-02: 15 mL via OROMUCOSAL
  Filled 2023-04-02: qty 15

## 2023-04-02 MED ORDER — LIDOCAINE HCL (CARDIAC) PF 50 MG/5ML IV SOSY
PREFILLED_SYRINGE | INTRAVENOUS | Status: DC | PRN
  Administered 2023-04-02: 80 mg via INTRAVENOUS

## 2023-04-02 MED ORDER — ACETAMINOPHEN 325 MG PO TABS: 325.0000 mg | ORAL_TABLET | Freq: Four times a day (QID) | ORAL | Status: DC | PRN

## 2023-04-02 MED ORDER — METOCLOPRAMIDE HCL 5 MG/ML IJ SOLN: 5.0000 mg | Freq: Three times a day (TID) | INTRAMUSCULAR | Status: DC | PRN

## 2023-04-02 MED ORDER — ORAL CARE MOUTH RINSE
15.0000 mL | Freq: Once | OROMUCOSAL | Status: AC
  Administered 2023-04-02: 15 mL via OROMUCOSAL

## 2023-04-02 MED ORDER — FENTANYL CITRATE (PF) 100 MCG/2ML IJ SOLN
INTRAMUSCULAR | Status: DC | PRN
  Administered 2023-04-02: 50 ug via INTRAVENOUS
  Administered 2023-04-02 (×2): 25 ug via INTRAVENOUS

## 2023-04-02 MED ORDER — OXYCODONE HCL 5 MG/5ML PO SOLN: 5.0000 mg | Freq: Once | ORAL | Status: DC | PRN

## 2023-04-02 MED ORDER — LACTATED RINGERS IV SOLN: INTRAVENOUS | Status: DC

## 2023-04-02 MED ORDER — ONDANSETRON HCL 4 MG PO TABS: 4.0000 mg | ORAL_TABLET | Freq: Four times a day (QID) | ORAL | Status: DC | PRN

## 2023-04-02 MED ORDER — KETOROLAC TROMETHAMINE 30 MG/ML IJ SOLN
30.0000 mg | Freq: Once | INTRAMUSCULAR | Status: AC
  Administered 2023-04-02: 30 mg via INTRAVENOUS
  Filled 2023-04-02: qty 1

## 2023-04-02 MED ORDER — CHLORHEXIDINE GLUCONATE 0.12 % MT SOLN: 15.0000 mL | Freq: Once | OROMUCOSAL | Status: AC

## 2023-04-02 MED ORDER — PROPOFOL 10 MG/ML IV BOLUS
INTRAVENOUS | Status: AC
  Filled 2023-04-02: qty 20

## 2023-04-02 MED ORDER — DOCUSATE SODIUM 100 MG PO CAPS
100.0000 mg | ORAL_CAPSULE | Freq: Two times a day (BID) | ORAL | Status: DC
  Administered 2023-04-02 – 2023-04-05 (×6): 100 mg via ORAL
  Filled 2023-04-02 (×7): qty 1

## 2023-04-02 MED ORDER — POVIDONE-IODINE 10 % EX SWAB
2.0000 | Freq: Once | CUTANEOUS | Status: AC
  Administered 2023-04-02: 2 via TOPICAL

## 2023-04-02 MED ORDER — ONDANSETRON HCL 4 MG/2ML IJ SOLN: 4.0000 mg | Freq: Four times a day (QID) | INTRAMUSCULAR | Status: DC | PRN

## 2023-04-02 MED ORDER — OXYCODONE HCL 5 MG PO TABS: 5.0000 mg | ORAL_TABLET | Freq: Once | ORAL | Status: DC | PRN

## 2023-04-02 MED ORDER — OXYCODONE HCL 5 MG PO TABS
5.0000 mg | ORAL_TABLET | Freq: Once | ORAL | Status: AC
  Administered 2023-04-02: 5 mg via ORAL
  Filled 2023-04-02: qty 1

## 2023-04-02 MED ORDER — MORPHINE SULFATE (PF) 2 MG/ML IV SOLN: 0.5000 mg | INTRAVENOUS | Status: DC | PRN

## 2023-04-02 MED ORDER — FENTANYL CITRATE PF 50 MCG/ML IJ SOSY: 25.0000 ug | PREFILLED_SYRINGE | INTRAMUSCULAR | Status: DC | PRN

## 2023-04-02 MED ORDER — HEPARIN SODIUM (PORCINE) 5000 UNIT/ML IJ SOLN
5000.0000 [IU] | Freq: Three times a day (TID) | INTRAMUSCULAR | Status: DC
  Administered 2023-04-02 – 2023-04-05 (×8): 5000 [IU] via SUBCUTANEOUS
  Filled 2023-04-02 (×8): qty 1

## 2023-04-02 MED ORDER — HYDROCODONE-ACETAMINOPHEN 7.5-325 MG PO TABS
1.0000 | ORAL_TABLET | ORAL | Status: DC | PRN
  Administered 2023-04-02: 2 via ORAL
  Filled 2023-04-02: qty 2

## 2023-04-02 MED ORDER — SODIUM CHLORIDE 0.9 % IV SOLN
2.0000 g | Freq: Three times a day (TID) | INTRAVENOUS | Status: DC
  Administered 2023-04-02 – 2023-04-05 (×8): 2 g via INTRAVENOUS
  Filled 2023-04-02 (×8): qty 12.5

## 2023-04-02 MED ORDER — ACETAMINOPHEN 500 MG PO TABS
500.0000 mg | ORAL_TABLET | Freq: Four times a day (QID) | ORAL | Status: AC
  Administered 2023-04-03 (×2): 500 mg via ORAL
  Filled 2023-04-02 (×3): qty 1

## 2023-04-02 SURGICAL SUPPLY — 33 items
BANDAGE ESMARK 4X12 BL STRL LF (DISPOSABLE) ×1 IMPLANT
BLADE SURG 15 STRL LF DISP TIS (BLADE) IMPLANT
BLADE SURG 15 STRL SS (BLADE) ×3
BNDG CMPR 12X4 ELC STRL LF (DISPOSABLE) ×1
BNDG CMPR STD VLCR NS LF 5.8X4 (GAUZE/BANDAGES/DRESSINGS) ×1
BNDG ELASTIC 4X5.8 VLCR NS LF (GAUZE/BANDAGES/DRESSINGS) ×1 IMPLANT
BNDG ESMARK 4X12 BLUE STRL LF (DISPOSABLE) ×1
BNDG GAUZE DERMACEA FLUFF 4 (GAUZE/BANDAGES/DRESSINGS) IMPLANT
BNDG GZE DERMACEA 4 6PLY (GAUZE/BANDAGES/DRESSINGS) ×2
CLOTH BEACON ORANGE TIMEOUT ST (SAFETY) ×1 IMPLANT
COVER LIGHT HANDLE STERIS (MISCELLANEOUS) ×2 IMPLANT
CUFF TOURN SGL QUICK 34 (TOURNIQUET CUFF) ×1
CUFF TRNQT CYL 34X4.125X (TOURNIQUET CUFF) IMPLANT
DRAIN PENROSE 0.5X18 (DRAIN) IMPLANT
ELECT REM PT RETURN 9FT ADLT (ELECTROSURGICAL) ×1
ELECTRODE REM PT RTRN 9FT ADLT (ELECTROSURGICAL) ×1 IMPLANT
GAUZE SPONGE 4X4 12PLY STRL (GAUZE/BANDAGES/DRESSINGS) IMPLANT
GLOVE BIOGEL PI IND STRL 7.0 (GLOVE) ×2 IMPLANT
GLOVE SURG POLYISO LF SZ8 (GLOVE) ×1 IMPLANT
GOWN STRL REUS W/TWL LRG LVL3 (GOWN DISPOSABLE) ×1 IMPLANT
GOWN STRL REUS W/TWL XL LVL3 (GOWN DISPOSABLE) ×1 IMPLANT
KIT TURNOVER KIT A (KITS) ×1 IMPLANT
MANIFOLD NEPTUNE II (INSTRUMENTS) ×1 IMPLANT
MARKER SKIN DUAL TIP RULER LAB (MISCELLANEOUS) ×1 IMPLANT
NS IRRIG 1000ML POUR BTL (IV SOLUTION) ×1 IMPLANT
PACK BASIC LIMB (CUSTOM PROCEDURE TRAY) IMPLANT
PAD ABD 5X9 TENDERSORB (GAUZE/BANDAGES/DRESSINGS) IMPLANT
PAD ARMBOARD 7.5X6 YLW CONV (MISCELLANEOUS) ×1 IMPLANT
POSITIONER HEAD 8X9X4 ADT (SOFTGOODS) ×1 IMPLANT
SET BASIN LINEN APH (SET/KITS/TRAYS/PACK) ×1 IMPLANT
SOLUTION IRRIG SURGIPHOR (IV SOLUTION) IMPLANT
SWAB CULTURE ESWAB REG 1ML (MISCELLANEOUS) ×1 IMPLANT
SYR BULB IRRIG 60ML STRL (SYRINGE) ×1 IMPLANT

## 2023-04-02 NOTE — Anesthesia Preprocedure Evaluation (Signed)
Anesthesia Evaluation  Patient identified by MRN, date of birth, ID band Patient awake    Reviewed: Allergy & Precautions, H&P , NPO status , Patient's Chart, lab work & pertinent test results, reviewed documented beta blocker date and time   Airway Mallampati: II  TM Distance: >3 FB Neck ROM: full    Dental no notable dental hx.    Pulmonary neg pulmonary ROS   Pulmonary exam normal breath sounds clear to auscultation       Cardiovascular Exercise Tolerance: Good negative cardio ROS  Rhythm:regular Rate:Normal     Neuro/Psych negative neurological ROS  negative psych ROS   GI/Hepatic negative GI ROS, Neg liver ROS,,,  Endo/Other  negative endocrine ROS    Renal/GU negative Renal ROS  negative genitourinary   Musculoskeletal   Abdominal   Peds  Hematology negative hematology ROS (+)   Anesthesia Other Findings   Reproductive/Obstetrics negative OB ROS                             Anesthesia Physical Anesthesia Plan  ASA: 1 and emergent  Anesthesia Plan: General and General LMA   Post-op Pain Management:    Induction:   PONV Risk Score and Plan: Ondansetron  Airway Management Planned:   Additional Equipment:   Intra-op Plan:   Post-operative Plan:   Informed Consent: I have reviewed the patients History and Physical, chart, labs and discussed the procedure including the risks, benefits and alternatives for the proposed anesthesia with the patient or authorized representative who has indicated his/her understanding and acceptance.     Dental Advisory Given  Plan Discussed with: CRNA  Anesthesia Plan Comments:        Anesthesia Quick Evaluation

## 2023-04-02 NOTE — Transfer of Care (Signed)
Immediate Anesthesia Transfer of Care Note  Patient: Jerome Sutton  Procedure(s) Performed: INCISION AND DRAINAGE ABSCESS (Left: Foot)  Patient Location: PACU  Anesthesia Type:General  Level of Consciousness: awake  Airway & Oxygen Therapy: Patient Spontanous Breathing  Post-op Assessment: Report given to RN  Post vital signs: Reviewed and stable  Last Vitals:  Vitals Value Taken Time  BP    Temp    Pulse    Resp    SpO2      Last Pain:  Vitals:   04/02/23 0927  TempSrc: Oral  PainSc: 0-No pain         Complications: No notable events documented.

## 2023-04-02 NOTE — Progress Notes (Signed)
PROGRESS NOTE  Jerome Sutton XBM:[id number] DOB: Nov 22, 1980 DOA: 04/01/2023 PCP: Patient, No Pcp Per  Brief History:  42 year old male with no documented chronic medical problems presenting with a 3-week history of left plantar foot wound and swelling.  The patient states that he noted a small wound on the lateral aspect of the plantar surface of his left foot about 3 weeks prior to this admission.  He had noted some swelling at that time.  He stated that he had put some type of OTC salve on his plantar wound.  He has noted some on and off swelling for the past 3 weeks, but states that the wound had not improved very much.  He began noticing some more drainage from his plantar surface on 03/29/2023.  In addition, he noted some leakage from the dorsal wound on his left foot fifth metatarsal area on 03/31/2023.  Because of continued swelling and drainage, he presented for further evaluation and treatment. Patient denies fevers, chills, headache, chest pain, dyspnea, nausea, vomiting, diarrhea, abdominal pain, dysuria, hematuria, hematochezia, and melena.  Notably, the patient had a similar wound back in January 2019.  MRI on 07/04/2017 showed septic arthritis of the left fourth MTP joint with bone destruction of the fourth distal metatarsal and fourth proximal phalanx.  At that time, the patient was taken to the OR by Dr. Fuller Canada where the patient underwent left fourth MTP joint resection and resection of the fourth toe.  In the ED, the patient was afebrile but tachycardic.  He is hemodynamically stable.  Oxygen saturation is 100% room air.  WBC 3.9, hemoglobin 13.0, platelets 301.  Sodium 130, potassium 3.8, bicarbonate 26, serum creatinine 0.92.  AST 31, ALT 43, alkaline phosphatase 52, total bilirubin 0.4.  Lactic acid 2.2. CT foot showed septic arthritis of the fifth MTP joint with osteolysis of the distal fifth metatarsal shaft and base of the fifth phalanx.  There was underlying  abscess overlying the fifth MTP joint.  There is an additional rim-enhancing fluid collection on the dorsal midfoot. EDP contacted Ortho, Dr. Fuller Canada who agreed to consult on the patient.  MRI was requested of the left foot.   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 Fuller Canada consulted -will need ID follow up for prolonged abx after d/c   Lactic acidosis -Secondary to volume depletion and infectious process -lactate 2.2>>1.5 -Continue IV fluids>>improved      Family Communication:   no Family at bedside  Consultants:  ortho  Code Status:  FULL   DVT Prophylaxis:  Curran Heparin  Procedures: As Listed in Progress Note Above  Antibiotics: Vanc 10/7>> Ceftriaxone 10/7>>      Subjective: Patient denies fevers, chills, headache, chest pain, dyspnea, nausea, vomiting, diarrhea, abdominal pain, dysuria, hematuria, hematochezia, and melena.   Objective: Vitals:   04/01/23 1315 04/01/23 1447 04/01/23 1845 04/01/23 2005  BP: 129/77 133/77 130/77 127/70  Pulse: 76 78 76 62  Resp: 16 18 18 18   Temp: 97.9 F (36.6 C) 98.4 F (36.9 C) 98 F (36.7 C) 98.5 F (36.9 C)  TempSrc: Oral Oral Oral Oral  SpO2: 98% 100% 98% 98%  Weight:      Height:  Intake/Output Summary (Last 24 hours) at 04/02/2023 0737 Last data filed at 04/01/2023 1610 Gross per 24 hour  Intake 1442.44 ml  Output --  Net 1442.44 ml   Weight change:  Exam:  General:  Pt is alert, follows commands appropriately, not in acute distress HEENT: No icterus, No thrush, No neck mass, [place]/AT Cardiovascular: RRR, S1/S2, no rubs, no gallops Respiratory: CTA bilaterally, no wheezing, no crackles,  no rhonchi Abdomen: Soft/+BS, non tender, non distended, no guarding Extremities: left foot photos on 10/8>>       Data Reviewed: I have personally reviewed following labs and imaging studies Basic Metabolic Panel: Recent Labs  Lab 04/01/23 0843 04/02/23 0507  NA 138 137  K 3.8 3.6  CL 103 102  CO2 26 26  GLUCOSE 120* 106*  BUN 12 9  CREATININE 0.92 0.86  CALCIUM 8.5* 8.5*   Liver Function Tests: Recent Labs  Lab 04/01/23 0843  AST 31  ALT 43  ALKPHOS 52  BILITOT 0.4  PROT 7.9  ALBUMIN 3.2*   No results for input(s): "LIPASE", "AMYLASE" in the last 168 hours. No results for input(s): "AMMONIA" in the last 168 hours. Coagulation Profile: No results for input(s): "INR", "PROTIME" in the last 168 hours. CBC: Recent Labs  Lab 04/01/23 0843 04/02/23 0507  WBC 3.9* 4.3  NEUTROABS 2.8  --   HGB 13.0 12.3*  HCT 42.6 40.1  MCV 91.6 90.9  PLT 301 305   Cardiac Enzymes: No results for input(s): "CKTOTAL", "CKMB", "CKMBINDEX", "TROPONINI" in the last 168 hours. BNP: Invalid input(s): "POCBNP" CBG: No results for input(s): "GLUCAP" in the last 168 hours. HbA1C: No results for input(s): "HGBA1C" in the last 72 hours. Urine analysis:    Component Value Date/Time   COLORURINE YELLOW 02/18/2012 1602   APPEARANCEUR CLEAR 02/18/2012 1602   LABSPEC 1.028 02/18/2012 1602   PHURINE 6.0 02/18/2012 1602   GLUCOSEU NEGATIVE 02/18/2012 1602   HGBUR NEGATIVE 02/18/2012 1602   BILIRUBINUR NEGATIVE 02/18/2012 1602   KETONESUR 15 (A) 02/18/2012 1602   PROTEINUR NEGATIVE 02/18/2012 1602   UROBILINOGEN 1.0 02/18/2012 1602   NITRITE NEGATIVE 02/18/2012 1602   LEUKOCYTESUR NEGATIVE 02/18/2012 1602   Sepsis Labs: @LABRCNTIP (procalcitonin:4,lacticidven:4) ) Recent Results (from the past 240 hour(s))  Surgical PCR screen     Status: Abnormal   Collection Time: 04/01/23  4:14 PM   Specimen: Nasal Mucosa; Nasal Swab  Result Value Ref Range Status   MRSA, PCR NEGATIVE  NEGATIVE Final   Staphylococcus aureus POSITIVE (A) NEGATIVE Final    Comment: (NOTE) The Xpert SA Assay (FDA approved for NASAL specimens in patients 39 years of age and older), is one component of a comprehensive surveillance program. It is not intended to diagnose infection nor to guide or monitor treatment. Performed at [facility], [address] [place], [place] [address]      Scheduled Meds:  chlorhexidine  60 mL Topical Once   Chlorhexidine Gluconate Cloth  6 each Topical Daily   heparin  5,000 Units Subcutaneous Q8H   influenza vac split trivalent PF  0.5 mL Intramuscular Tomorrow-1000   mupirocin ointment  1 Application Nasal BID   Continuous Infusions:  cefTRIAXone (ROCEPHIN)  IV 2 g (04/01/23 2205)   vancomycin 1,000 mg (04/01/23 2156)    Procedures/Studies: MR FOOT LEFT W WO CONTRAST  Result Date: 04/01/2023 CLINICAL DATA:  Soft tissue infection suspected.  Osteomyelitis. EXAM: MRI OF THE LEFT FOREFOOT WITHOUT AND WITH CONTRAST TECHNIQUE: Multiplanar, multisequence MR imaging of the left  forefoot was performed both before and after administration of intravenous contrast. CONTRAST:  10mL GADAVIST GADOBUTROL 1 MMOL/ML IV SOLN COMPARISON:  Radiographs 04/01/2023 and 07/03/2017. CT 04/01/2023 and MRI 07/04/2017. FINDINGS: Bones/Joint/Cartilage As shown on earlier radiographs and CT, there is septic arthritis at the 5th metatarsophalangeal joint with acute osteomyelitis involving the 5th metatarsal and base of the 5th proximal phalanx. Marrow T2 hyperintensity and enhancement extends into the base of the 5th metatarsal. 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 No evidence of osteomyelitis within the 1st through 3rd rays or visualized bones of the midfoot. Chronic deformities of the 2nd and 3rd toes. Evaluation of  the proximal phalanx of the great toe is limited by artifact from the linear metallic foreign body seen on the earlier radiographs. Ligaments Intact Lisfranc ligament. The collateral ligaments of the remaining metatarsophalangeal joints are intact. Muscles and Tendons Generalized forefoot muscular atrophy and edema. No significant tenosynovitis identified. Soft tissues Soft tissue ulceration along the plantar aspect of the 5th metatarsophalangeal joint with underlying ill-defined, peripherally enhancing fluid collection surrounding the destroyed joint. Apparent additional area of soft tissue ulceration dorsally with a peripherally enhancing fluid collection dorsal to the 4th metatarsal remnant, measuring up to 1.9 cm on image 20/13. There is a 3rd peripherally enhancing fluid collection along the lateral and plantar aspect of the 5th metatarsal base which measures up to 1.3 cm on image 8/13. No obvious soft tissue ulceration identified in this region. IMPRESSION: 1. Septic arthritis at the 5th metatarsophalangeal joint with acute osteomyelitis involving the 5th metatarsal and base of the 5th proximal phalanx. 2. Soft tissue ulceration along the plantar aspect of the 5th metatarsophalangeal joint with underlying ill-defined, peripherally enhancing fluid collection surrounding the destroyed joint, consistent with an abscess. 3. Additional soft tissue ulceration dorsally with peripherally enhancing fluid collection dorsal to the 4th metatarsal remnant, measuring up to 1.9 cm. This could reflect a postsurgical fluid collection, although suspicious for an additional abscess. 4. 3rd peripherally enhancing fluid collection along the lateral and plantar aspect of the 5th metatarsal base measuring up to 1.3 cm. 5. 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 6.  Chronic deformities of the 2nd and 3rd toes. Evaluation of the proximal phalanx of the great toe is limited by artifact from the linear metallic foreign body seen on the earlier radiographs. Electronically Signed   By: Carey Bullocks M.D.   On: 04/01/2023 13:48   DG Foot Complete Left  Result Date: 04/01/2023 CLINICAL DATA:  09811 Abscess [id number] EXAM: LEFT FOOT - COMPLETE 3+ VIEW COMPARISON:  Same-day CT of the left foot at 8:43 a.m. FINDINGS: Plantar soft tissue wound of the forefoot at the level of fifth MTP articulation. Additional region of subcutaneous edema noted at the lateral midfoot, overlying the base of fifth metatarsal. There is essentially complete destruction of fifth MTP joint with osteolysis involving the remnant fifth distal metatarsal shaft and base of fifth proximal phalanx. Redemonstration of sequela of prior septic arthritis of fourth MTP joint with chronic appearing bony destruction and possible postsurgical changes of the distal fourth metatarsal and base of fourth proximal phalanx. Severe dorsiflexion is again noted at the second and third MTP joints. There is a 9 mm linear radiopaque foreign body projecting in the lateral soft tissues of the first proximal  phalanx. IMPRESSION: 1. Septic arthritis of fifth MTP joint with findings concerning for osteomyelitis of the fifth distal metatarsal shaft and base of the fifth proximal phalanx with an overlying plantar soft tissue wound. 2. Sequela of prior septic arthritis of the fourth MTP joint. 3. 9 mm linear radiopaque foreign body in the lateral soft tissues of the first proximal phalanx. Electronically Signed   By: Hart Robinsons M.D.   On: 04/01/2023 13:09   CT Foot Left Wo Contrast  Result Date: 04/01/2023 CLINICAL DATA:  Osteomyelitis suspected, foot, xray done h/ osteomyelitis, r/o nec fasc. EXAM: CT OF THE LEFT FOOT WITHOUT CONTRAST TECHNIQUE: Multidetector CT imaging of the left foot was performed according to the standard  protocol. Multiplanar CT image reconstructions were also generated. RADIATION DOSE REDUCTION: This exam was performed according to the departmental dose-optimization program which includes automated exposure control, adjustment of the mA and/or kV according to patient size and/or use of iterative reconstruction technique. COMPARISON:  MRI left foot dated July 04, 2017. FINDINGS: Bones/Joint/Cartilage There has been complete destruction of the fifth MTP joint with regions of cortical indistinctness and osteolysis involving the remnant fifth distal metatarsal metaphyseal shaft and base of the fifth proximal phalanx. Punctate curvilinear foci of mineralization within the region of the fifth MTP articulation. The remnant base of fifth proximal phalanx is subluxed dorsally relative to the remnant fifth distal metatarsal shaft. Redemonstration of sequela of prior septic arthritis of the fourth MTP joint with associated chronic appearing bony destruction and possible postsurgical changes of the distal fourth metatarsal and base of fourth proximal phalanx. No definite evidence of new osteolysis or periostitis involving the remnant fourth metatarsal shaft and fourth proximal phalanx. There is pronounced dorsiflexion noted at the second and third MTP joints. No additional areas of discernible osteolysis or periostitis identified. Punctate ossicles at the base of the proximal cuboid, may relate to prior trauma, however, the more dominant sized ossicle may represent an os peroneum. The visualized hindfoot and midfoot articulations are intact. Ligaments Suboptimally assessed by CT. Soft tissues There is a plantar soft tissue wound of the forefoot at the level of the fifth MTP joint which tracks deep to communicate with a peripherally enhancing fluid collection, with extension into the region of the bony destructive changes of the fifth MTP joint, as described above. This collection measures 2.6 cm transverse x 2.7 cm AP x 1.6  cm craniocaudal (series 7, image 96 and series 9, images 123 and 124). There is surrounding edema and infiltration of the subcutaneous fat with overlying cutaneous thickening. No soft tissue gas. Cutaneous thickening is noted involving the soft tissues of the lateral midfoot, overlying the base of fifth metatarsal. There is underlying subcutaneous edema and ill-defined fluid with a loculated irregular peripherally enhancing fluid collection measuring 1.1 cm transverse x 0.6 cm AP x 0.6 cm craniocaudal (series 7, image 155 and series 9, image 126). There is a focal region of cutaneous thickening overlying the head of third metatarsal with mild surrounding edema. No discrete fluid collection is identified in this region. There is dorsal and lateral subcutaneous edema of the midfoot and forefoot. IMPRESSION: 1. Septic arthritis of the fifth MTP joint with osteolysis of the fifth distal metatarsal shaft and base of fifth proximal phalanx, concerning for osteomyelitis. There is an underlying abscess, with measurements as above, which communicates with a plantar soft tissue wound overlying the fifth MTP joint and surrounding cellulitis. 2. Additional rim enhancing fluid collection, favored to represent an abscess, involving the  soft tissues of the lateral midfoot, overlying the base of fifth metatarsal. Cellulitis of the dorsal and lateral midfoot through forefoot. 3. Focal cutaneous thickening overlying the head of the third metatarsal with mild surrounding edema could represent cellulitis. 4. Sequela of prior septic arthritis of the fourth MTP joint with associated chronic appearing bony destruction and possible postoperative changes of the distal fourth metatarsal and base of the fourth proximal phalanx without definite evidence of new osteolysis or periostitis. Electronically Signed   By: Hart Robinsons M.D.   On: 04/01/2023 09:56    Catarina Hartshorn, DO  [facility]  If 7PM-7AM, please contact  night-coverage [email or web address] Password [email or web address] 04/02/2023, 7:37 AM   LOS: 1 day

## 2023-04-02 NOTE — Consult Note (Signed)
HOSPITAL CONSULT  [facility]   Patient ID: Jerome Sutton, male   DOB: 15-Dec-1980, 42 y.o.   MRN: [id number]  New patient  Requested by: Sherrie George, MD  Reason for: Abscess left foot  Based on the information below I recommend incision and drainage left foot  Chief Complaint  Patient presents with   Wound Check     HPI  41 year old male presented in 2019 with an abscess of his left foot underwent incision and drainage and recovered nicely.  He is employed as a Environmental consultant  PRE-OPERATIVE DIAGNOSIS:  infection, abscess and septic arthritis left foot   POST-OPERATIVE DIAGNOSIS:  infection, abscess and septic arthritis left foot   PROCEDURE:  Procedure(s): INCISION AND DRAINAGE ABSCESS LEFT FOOT AND BONE RESECTION (Left)  Metatarsal phalangeal joint resection fourth digit Manual manipulation second and third digit interphalangeal joints   Operative findings Large abscess metatarsal phalangeal joint Proximal phalanx proximal half bone was dead, distal metatarsal distal 2 cm of bone was dated the head was not present it had morphed into a arrow point configuration of the metatarsal Plantar IPK  3 weeks ago he started having pain and swelling in his left foot it progressively worsened he presented to the emergency room on October 7 with pain and swelling of his left foot  Imaging x-ray CT and MRI shows an abscess on the lateral side of the left foot in the area of previous surgery  Review of Systems (all) Review of Systems  All other systems reviewed and are negative. He met sepsis protocol although he was not febrile    History reviewed. No pertinent past medical history.  Past Surgical History:  Procedure Laterality Date   BACK SURGERY     INCISION AND DRAINAGE ABSCESS Left 07/04/2017   Procedure: INCISION AND DRAINAGE ABSCESS LEFT FOOT AND FOURTH METATARSOPHALANGEAL BONE RESECTION AND MANUAL MANIPULATION OF SECOND AND THIRD TOES;  Surgeon:  Vickki Hearing, MD;  Location: [facility];  Service: Orthopedics;  Laterality: Left;   LUMBAR LAMINECTOMY/DECOMPRESSION MICRODISCECTOMY  02/18/2012   Procedure: LUMBAR LAMINECTOMY/DECOMPRESSION MICRODISCECTOMY 1 LEVEL;  Surgeon: Mariam Dollar, MD;  Location: [facility];  Service: Neurosurgery;  Laterality: Bilateral;  Bilateral Lumbar Laminectomy and diskectomy    History reviewed. No pertinent family history. Social History   Tobacco Use   Smoking status: Never   Smokeless tobacco: Never  Vaping Use   Vaping status: Never Used  Substance Use Topics   Alcohol use: No   Drug use: No   No Known Allergies  Current Facility-Administered Medications:    acetaminophen (TYLENOL) tablet 650 mg, 650 mg, Oral, Q6H PRN **OR** acetaminophen (TYLENOL) suppository 650 mg, 650 mg, Rectal, Q6H PRN, Tat, David, MD   cefTRIAXone (ROCEPHIN) 2 g in sodium chloride 0.9 % 100 mL IVPB, 2 g, Intravenous, Q24H, Tat, David, MD, Last Rate: 200 mL/hr at 04/01/23 2205, 2 g at 04/01/23 2205   chlorhexidine (HIBICLENS) 4 % liquid 4 Application, 60 mL, Topical, Once, Vickki Hearing, MD   Chlorhexidine Gluconate Cloth 2 % PADS 6 each, 6 each, Topical, Daily, Vickki Hearing, MD, 6 each at 04/01/23 2055   heparin injection 5,000 Units, 5,000 Units, Subcutaneous, Q8H, Tat, Onalee Hua, MD, 5,000 Units at 04/02/23 0525   influenza vac split trivalent PF (FLULAVAL) injection 0.5 mL, 0.5 mL, Intramuscular, Tomorrow-1000, Tat, Onalee Hua, MD   lactated ringers infusion, , Intravenous, Continuous, Tat, David, MD, Last Rate: 100 mL/hr at 04/02/23 0159, New Bag at 04/02/23 0159  mupirocin ointment (BACTROBAN) 2 % 1 Application, 1 Application, Nasal, BID, Vickki Hearing, MD, 1 Application at 04/01/23 2207   ondansetron (ZOFRAN) tablet 4 mg, 4 mg, Oral, Q6H PRN **OR** ondansetron (ZOFRAN) injection 4 mg, 4 mg, Intravenous, Q6H PRN, Tat, David, MD   Oral care mouth rinse, 15 mL, Mouth Rinse, PRN, Tat, Onalee Hua, MD   vancomycin  (VANCOCIN) IVPB 1000 mg/200 mL premix, 1,000 mg, Intravenous, Q12H, Earnie Larsson, [facility], Last Rate: 200 mL/hr at 04/01/23 2156, 1,000 mg at 04/01/23 2156    Physical Exam(=30) BP 127/70 (BP Location: Left Arm)   Pulse 62   Temp 98.5 F (36.9 C) (Oral)   Resp 18   Ht 5\' 7"  (1.702 m)   Wt 94.3 kg   SpO2 98%   BMI 32.58 kg/m   Gen. Appearance normal well-developed well-nourished grooming hygiene also normal Peripheral vascular system normal Lymph nodes ARE NORMAL  Gait abnormal  Left Upper extremity  Inspection revealed no malalignment or asymmetry  Assessment of range of motion: Full range of motion was recorded  Assessment of stability: Elbow wrist and hand and shoulder were stable  Assessment of muscle strength and tone revealed grade 5 muscle strength and normal muscle tone  Skin was normal without rash lesion or ulceration  Right upper extremity  Inspection revealed no malalignment or asymmetry  Assessment of range of motion: Full range of motion was recorded  Assessment of stability: Elbow wrist and hand and shoulder were stable  Assessment of muscle strength and tone revealed grade 5 muscle strength and normal muscle tone  Skin was normal without rash lesion or ulceration  Right Lower extremity  Inspection revealed no malalignment or asymmetry  Assessment of range of motion: Full range of motion was recorded  Assessment of stability: Ankle, knee and hip were stable  Assessment of muscle strength and tone revealed grade 5 muscle strength and normal muscle tone  Skin was normal without rash lesion or ulceration  Left lower extremity Assessment of the left lower extremities deferred until the time of surgery and an addendum will be added to the physical exam at that time   Coordination was tested by finger-to-nose nose and was normal Deep tendon reflexes were 2+ in the upper extremities was normal Examination of sensation by touch was normal  Mental status   Oriented to time person and place normal  Mood and affect normal without depression anxiety or agitation  Dx:   Data Reviewed  ER RECORD REVIEWED: CONFIRMS HISTORY   I reviewed the following images and the reports and my independent interpretation is   Imaging #1 plain film destruction of the fifth metatarsal head part of the shaft and part of the phalanx, previous resection of the fourth metatarsal and part of the phalanx noted  Imaging #2 CT suspect abscess identified  Image #3 MRI abscess clearly identified in the fifth metatarsal region with bone loss fourth and fifth metatarsal as well as phalanges of the fourth and fifth digits   Assessment     Latest Ref Rng & Units 04/02/2023    5:07 AM 04/01/2023    8:43 AM 07/06/2017    5:36 AM  CBC  WBC 4.0 - 10.5 K/uL 4.3  3.9  5.6   Hemoglobin 13.0 - 17.0 g/dL 16.1  09.6  04.5   Hematocrit 39.0 - 52.0 % 40.1  42.6  39.9   Platelets 150 - 400 K/uL 305  301  222      Recurrent abscess  left foot  Plan   Incision and drainage left foot  I have spoken to a foot and ankle specialist in [place] who recommended a transmetatarsal amputation however, that is not a procedure I am familiar with doing.  So I am going to clean out the infection  Once that is under control I will send him for evaluation for possible transmetatarsal amputation  The procedure has been fully reviewed with the patient; The risks and benefits of surgery have been discussed and explained and understood. Alternative treatment has also been reviewed, questions were encouraged and answered. The postoperative plan is also been reviewed.    Vickki Hearing MD

## 2023-04-02 NOTE — Anesthesia Procedure Notes (Signed)
Procedure Name: LMA Insertion Date/Time: 04/02/2023 10:22 AM  Performed by: Moshe Salisbury, CRNAPre-anesthesia Checklist: Patient identified, Patient being monitored, Emergency Drugs available, Timeout performed and Suction available Patient Re-evaluated:Patient Re-evaluated prior to induction Oxygen Delivery Method: Circle System Utilized Preoxygenation: Pre-oxygenation with 100% oxygen Induction Type: IV induction Ventilation: Mask ventilation without difficulty LMA: LMA inserted LMA Size: 4.0 Number of attempts: 1 Placement Confirmation: positive ETCO2 and breath sounds checked- equal and bilateral

## 2023-04-02 NOTE — Progress Notes (Signed)
   04/02/23 1405  TOC Brief Assessment  Insurance and Status Reviewed  Patient has primary care physician No  Home environment has been reviewed from home  Prior level of function: independent  Prior/Current Home Services No current home services  Social Determinants of Health Reivew SDOH reviewed no interventions necessary  Readmission risk has been reviewed Yes  Transition of care needs transition of care needs identified, TOC will continue to follow   Per MD, pt will potentially need IV anbx at dc. Awaiting ID consult. No PCP listed in chart. Anticipating that ID MD can follow for the [facility] orders after dc. Will refer to [employer] for home arrangements if needed. TOC will follow.

## 2023-04-02 NOTE — Plan of Care (Signed)
  Problem: Education: Goal: Knowledge of General Education information will improve Description: Including pain rating scale, medication(s)/side effects and non-pharmacologic comfort measures Outcome: Progressing   Problem: Activity: Goal: Risk for activity intolerance will decrease Outcome: Progressing   Problem: Coping: Goal: Level of anxiety will decrease Outcome: Progressing   

## 2023-04-02 NOTE — H&P (View-Only) (Signed)
HOSPITAL CONSULT  [facility]   Patient ID: Jerome Sutton, male   DOB: 15-Dec-1980, 42 y.o.   MRN: [id number]  New patient  Requested by: Sherrie George, MD  Reason for: Abscess left foot  Based on the information below I recommend incision and drainage left foot  Chief Complaint  Patient presents with   Wound Check     HPI  41 year old male presented in 2019 with an abscess of his left foot underwent incision and drainage and recovered nicely.  He is employed as a Environmental consultant  PRE-OPERATIVE DIAGNOSIS:  infection, abscess and septic arthritis left foot   POST-OPERATIVE DIAGNOSIS:  infection, abscess and septic arthritis left foot   PROCEDURE:  Procedure(s): INCISION AND DRAINAGE ABSCESS LEFT FOOT AND BONE RESECTION (Left)  Metatarsal phalangeal joint resection fourth digit Manual manipulation second and third digit interphalangeal joints   Operative findings Large abscess metatarsal phalangeal joint Proximal phalanx proximal half bone was dead, distal metatarsal distal 2 cm of bone was dated the head was not present it had morphed into a arrow point configuration of the metatarsal Plantar IPK  3 weeks ago he started having pain and swelling in his left foot it progressively worsened he presented to the emergency room on October 7 with pain and swelling of his left foot  Imaging x-ray CT and MRI shows an abscess on the lateral side of the left foot in the area of previous surgery  Review of Systems (all) Review of Systems  All other systems reviewed and are negative. He met sepsis protocol although he was not febrile    History reviewed. No pertinent past medical history.  Past Surgical History:  Procedure Laterality Date   BACK SURGERY     INCISION AND DRAINAGE ABSCESS Left 07/04/2017   Procedure: INCISION AND DRAINAGE ABSCESS LEFT FOOT AND FOURTH METATARSOPHALANGEAL BONE RESECTION AND MANUAL MANIPULATION OF SECOND AND THIRD TOES;  Surgeon:  Vickki Hearing, MD;  Location: [facility];  Service: Orthopedics;  Laterality: Left;   LUMBAR LAMINECTOMY/DECOMPRESSION MICRODISCECTOMY  02/18/2012   Procedure: LUMBAR LAMINECTOMY/DECOMPRESSION MICRODISCECTOMY 1 LEVEL;  Surgeon: Mariam Dollar, MD;  Location: [facility];  Service: Neurosurgery;  Laterality: Bilateral;  Bilateral Lumbar Laminectomy and diskectomy    History reviewed. No pertinent family history. Social History   Tobacco Use   Smoking status: Never   Smokeless tobacco: Never  Vaping Use   Vaping status: Never Used  Substance Use Topics   Alcohol use: No   Drug use: No   No Known Allergies  Current Facility-Administered Medications:    acetaminophen (TYLENOL) tablet 650 mg, 650 mg, Oral, Q6H PRN **OR** acetaminophen (TYLENOL) suppository 650 mg, 650 mg, Rectal, Q6H PRN, Tat, David, MD   cefTRIAXone (ROCEPHIN) 2 g in sodium chloride 0.9 % 100 mL IVPB, 2 g, Intravenous, Q24H, Tat, David, MD, Last Rate: 200 mL/hr at 04/01/23 2205, 2 g at 04/01/23 2205   chlorhexidine (HIBICLENS) 4 % liquid 4 Application, 60 mL, Topical, Once, Vickki Hearing, MD   Chlorhexidine Gluconate Cloth 2 % PADS 6 each, 6 each, Topical, Daily, Vickki Hearing, MD, 6 each at 04/01/23 2055   heparin injection 5,000 Units, 5,000 Units, Subcutaneous, Q8H, Tat, Onalee Hua, MD, 5,000 Units at 04/02/23 0525   influenza vac split trivalent PF (FLULAVAL) injection 0.5 mL, 0.5 mL, Intramuscular, Tomorrow-1000, Tat, Onalee Hua, MD   lactated ringers infusion, , Intravenous, Continuous, Tat, David, MD, Last Rate: 100 mL/hr at 04/02/23 0159, New Bag at 04/02/23 0159  mupirocin ointment (BACTROBAN) 2 % 1 Application, 1 Application, Nasal, BID, Vickki Hearing, MD, 1 Application at 04/01/23 2207   ondansetron (ZOFRAN) tablet 4 mg, 4 mg, Oral, Q6H PRN **OR** ondansetron (ZOFRAN) injection 4 mg, 4 mg, Intravenous, Q6H PRN, Tat, David, MD   Oral care mouth rinse, 15 mL, Mouth Rinse, PRN, Tat, Onalee Hua, MD   vancomycin  (VANCOCIN) IVPB 1000 mg/200 mL premix, 1,000 mg, Intravenous, Q12H, Earnie Larsson, [facility], Last Rate: 200 mL/hr at 04/01/23 2156, 1,000 mg at 04/01/23 2156    Physical Exam(=30) BP 127/70 (BP Location: Left Arm)   Pulse 62   Temp 98.5 F (36.9 C) (Oral)   Resp 18   Ht 5\' 7"  (1.702 m)   Wt 94.3 kg   SpO2 98%   BMI 32.58 kg/m   Gen. Appearance normal well-developed well-nourished grooming hygiene also normal Peripheral vascular system normal Lymph nodes ARE NORMAL  Gait abnormal  Left Upper extremity  Inspection revealed no malalignment or asymmetry  Assessment of range of motion: Full range of motion was recorded  Assessment of stability: Elbow wrist and hand and shoulder were stable  Assessment of muscle strength and tone revealed grade 5 muscle strength and normal muscle tone  Skin was normal without rash lesion or ulceration  Right upper extremity  Inspection revealed no malalignment or asymmetry  Assessment of range of motion: Full range of motion was recorded  Assessment of stability: Elbow wrist and hand and shoulder were stable  Assessment of muscle strength and tone revealed grade 5 muscle strength and normal muscle tone  Skin was normal without rash lesion or ulceration  Right Lower extremity  Inspection revealed no malalignment or asymmetry  Assessment of range of motion: Full range of motion was recorded  Assessment of stability: Ankle, knee and hip were stable  Assessment of muscle strength and tone revealed grade 5 muscle strength and normal muscle tone  Skin was normal without rash lesion or ulceration  Left lower extremity Assessment of the left lower extremities deferred until the time of surgery and an addendum will be added to the physical exam at that time   Coordination was tested by finger-to-nose nose and was normal Deep tendon reflexes were 2+ in the upper extremities was normal Examination of sensation by touch was normal  Mental status   Oriented to time person and place normal  Mood and affect normal without depression anxiety or agitation  Dx:   Data Reviewed  ER RECORD REVIEWED: CONFIRMS HISTORY   I reviewed the following images and the reports and my independent interpretation is   Imaging #1 plain film destruction of the fifth metatarsal head part of the shaft and part of the phalanx, previous resection of the fourth metatarsal and part of the phalanx noted  Imaging #2 CT suspect abscess identified  Image #3 MRI abscess clearly identified in the fifth metatarsal region with bone loss fourth and fifth metatarsal as well as phalanges of the fourth and fifth digits   Assessment     Latest Ref Rng & Units 04/02/2023    5:07 AM 04/01/2023    8:43 AM 07/06/2017    5:36 AM  CBC  WBC 4.0 - 10.5 K/uL 4.3  3.9  5.6   Hemoglobin 13.0 - 17.0 g/dL 16.1  09.6  04.5   Hematocrit 39.0 - 52.0 % 40.1  42.6  39.9   Platelets 150 - 400 K/uL 305  301  222      Recurrent abscess  left foot  Plan   Incision and drainage left foot  I have spoken to a foot and ankle specialist in [place] who recommended a transmetatarsal amputation however, that is not a procedure I am familiar with doing.  So I am going to clean out the infection  Once that is under control I will send him for evaluation for possible transmetatarsal amputation  The procedure has been fully reviewed with the patient; The risks and benefits of surgery have been discussed and explained and understood. Alternative treatment has also been reviewed, questions were encouraged and answered. The postoperative plan is also been reviewed.    Vickki Hearing MD

## 2023-04-02 NOTE — Op Note (Addendum)
04/02/2023  10:58 AM  PATIENT:  Jerome Sutton  42 y.o. male  PRE-OPERATIVE DIAGNOSIS:  abscess left foot  POST-OPERATIVE DIAGNOSIS:  abscess left foot  PROCEDURE:  Procedure(s) with comments: INCISION AND DRAINAGE ABSCESS (Left) - left foot  Grayish fluid in the soft tissues on the dorsal and plantar aspect of the lateral side of the foot.  No gross abscess but obvious inflammatory tissue necrotic tissue indicating abscess inflammation or phlegmon  Large IPK 5 and 3  Details of procedure  Mr. Lahaie was seen in the preop area and cleared for surgery.  His left foot was marked as the surgical site confirmed with him in the chart and imaging  Imaging reviewed  Patient taken to surgery general anesthesia  Supine position  After timeout the limb was exsanguinated with 4 inch Esmarch tourniquet elevated to 300 mmHg  Previous incision opened up.  Grayish fluid necrotic muscular tissue no obvious purulent material.  Blunt dissection was carried out laterally and inferiorly and plantar down to the area of the IPK  Debridement was carried down to the bone plantarly and to the dorsal fascia dorsally  Culture was taken and antibiotics were started  The IPK was debrided sharply.  The bone was in this area and this was probably the nidus of the infection  The area was thoroughly debrided.  The wound was irrigated with saline and surgiphor  Penrose drain was taken through the dorsum of the foot plantarly  One 4 x 4 was soaked in surgiphor.  Sterile dressings and Kerlix and Ace wrap applied  IPK under #3 was also sharply debrided this did not go down to bone    SURGEON:  Surgeons and Role:    * Vickki Hearing, MD - Primary  PHYSICIAN ASSISTANT:   ASSISTANTS: none   ANESTHESIA:   general  EBL:  min   BLOOD ADMINISTERED:none  DRAINS: Penrose drain in the through the foot on the lateral side   LOCAL MEDICATIONS USED:  NONE  SPECIMEN:  No  Specimen  DISPOSITION OF SPECIMEN:  N/A  COUNTS:  YES  TOURNIQUET:   Total Tourniquet Time Documented: Thigh (Left) - 13 minutes Total: Thigh (Left) - 13 minutes   DICTATION: .Reubin Milan Dictation  PLAN OF CARE: Admit to inpatient   PATIENT DISPOSITION:  PACU - hemodynamically stable.   Delay start of Pharmacological VTE agent (>24hrs) due to surgical blood loss or risk of bleeding: not applicable   Plan postop  Continue IV antibiotics Adjust antibiotics per culture Remove drain in 24 to 48 hours and change dressing Arrange appointment with Dr. Lajoyce Corners for definitive management at a later date  Weightbearing through the heel

## 2023-04-02 NOTE — Interval H&P Note (Signed)
History and Physical Interval Note:  04/02/2023 9:50 AM  Jerome Sutton  has presented today for surgery, with the diagnosis of abscess left foot.  The various methods of treatment have been discussed with the patient and family. After consideration of risks, benefits and other options for treatment, the patient has consented to  Procedure(s) with comments: INCISION AND DRAINAGE ABSCESS (Left) - left foot as a surgical intervention.  The patient's history has been reviewed, patient examined, no change in status, stable for surgery.  I have reviewed the patient's chart and labs.  Questions were answered to the patient's satisfaction.     Fuller Canada

## 2023-04-02 NOTE — Brief Op Note (Signed)
04/02/2023  10:58 AM  PATIENT:  Jerome Sutton  42 y.o. male  PRE-OPERATIVE DIAGNOSIS:  abscess left foot  POST-OPERATIVE DIAGNOSIS:  abscess left foot  PROCEDURE:  Procedure(s) with comments: INCISION AND DRAINAGE ABSCESS (Left) - left foot  Grayish fluid in the soft tissues on the dorsal and plantar aspect of the lateral side of the foot.  No gross abscess but obvious inflammatory tissue necrotic tissue indicating abscess inflammation or phlegmon  Large IPK 5 and 3  Details of procedure  Jerome Sutton was seen in the preop area and cleared for surgery.  His left foot was marked as the surgical site confirmed with him in the chart and imaging  Imaging reviewed  Patient taken to surgery general anesthesia  Supine position  After timeout the limb was exsanguinated with 4 inch Esmarch tourniquet elevated to 300 mmHg  Previous incision opened up.  Grayish fluid necrotic muscular tissue no obvious purulent material.  Blunt dissection was carried out laterally and inferiorly and plantar down to the area of the IPK  Culture was taken and antibiotics were started  The IPK was debrided sharply.  The bone was in this area and this was probably the nidus of the infection  The area was thoroughly debrided.  The wound was irrigated with saline and surgiphor  Penrose drain was taken through the dorsum of the foot plantarly  One 4 x 4 was soaked in surgiphor.  Sterile dressings and Kerlix and Ace wrap applied  IPK under #3 was also sharply debrided this did not go down to bone    SURGEON:  Surgeons and Role:    * Vickki Hearing, MD - Primary  PHYSICIAN ASSISTANT:   ASSISTANTS: none   ANESTHESIA:   general  EBL:  min   BLOOD ADMINISTERED:none  DRAINS: Penrose drain in the through the foot on the lateral side    LOCAL MEDICATIONS USED:  NONE  SPECIMEN:  No Specimen  DISPOSITION OF SPECIMEN:  N/A  COUNTS:  YES  TOURNIQUET:   Total Tourniquet Time  Documented: Thigh (Left) - 13 minutes Total: Thigh (Left) - 13 minutes   DICTATION: .Jerome Sutton Dictation  PLAN OF CARE: Admit to inpatient   PATIENT DISPOSITION:  PACU - hemodynamically stable.   Delay start of Pharmacological VTE agent (>24hrs) due to surgical blood loss or risk of bleeding: not applicable   Plan postop  Continue IV antibiotics Adjust antibiotics per culture Remove drain in 24 to 48 hours and change dressing Arrange appointment with Dr. Lajoyce Corners for definitive management at a later date  Weightbearing through the heel

## 2023-04-02 NOTE — Consult Note (Signed)
Virtual Visit via Telephone/Video Note   I connected with Jerome Sutton   On 04/02/2023 at 3:30 PM  by Video and verified that I am speaking with the correct person using two identifiers.   I discussed the limitations, risks, security and privacy concerns of performing an evaluation and management service by video.  The patient expressed understanding and agreed to proceed.   Location:   Patient: AP Provider: [facility]    Date of Admission:  04/01/2023   Total days of inpatient antibiotics 0        Reason for Consult: Left foot abscess    Principal Problem:   Acute osteomyelitis of foot (HCC) Active Problems:   Septic arthritis of left foot (HCC)   Assessment:  41 YM admitted with: #Septic arthritis  +osteo with abscess of left 5th metatarsal SP I&D on 10/7 -Patient states that he has had left foot numbness since back surgery about 10 years ago.  On record review it did note that he had a history of left foot septic arthritis for which he underwent I&D in 2019 cultures at that time grew Morganella morganii, provedencia rettgerri, mixed anaerobic flora for which she was discharged on ciprofloxacin times 6 weeks. -Patient stable on arrival.  MRI showedLeft fifth MTP septic arthritis, osteomyelitis, overlying abscess with additional abscess at fifth metatarsal remnant - Taken to the OR with orthopedics on 10/7 for I&D of left foot noted grayish fluid and soft tissue of dorsal and plantar aspect of the lateral side of foot, no gross abscess but obvious inflammatory tissue necrotic indicating abscess inflammation phlegmon Recommendations:  -D/C ceftriaxone -start cefepime+ metronidazole -Continue vancomycin -Follow OR cx, anticipate 6 weeks of abx - Obtain A1c Microbiology:   Antibiotics: 10/7 vanc+ ctx 110/7 piptazo  Cultures: Blood 10/7 Urine  Other 10/7 OR Cx pending  HPI: Jerome Sutton is a 42 y.o. male with history  back surgery about 10 years ago leading to numbness in left foot and of left foot septic arthritis status post I&D in 2019 with cultures growing Morganella Morganella and procidentia rettgerri and mixed anaerobes  treated with ciprofloxacin x 6 weeks admitted with acute osteomyelitis and septic arthritis of the left foot.  Patient stated that foot swelling started about 3 weeks ago.,  Denies any trauma.  Denies prehospitalization antibiotics.  On arrival to the ED patient afebrile WBC 3.9K.  MRI left foot showed septic arthritis left fifth MTP with overlying abscess, osteomyelitis of fifth metatarsal, abscess that dorsal midfoot.  Taken to the OR patient was started on vancomycin and ceftriaxone.  ID initiate antibiotic recommendation.   Review of Systems: Review of Systems  All other systems reviewed and are negative.   History reviewed. No pertinent past medical history.  Social History   Tobacco Use   Smoking status: Never   Smokeless tobacco: Never  Vaping Use   Vaping status: Never Used  Substance Use Topics   Alcohol use: No   Drug use: No    History reviewed. No pertinent family history. Scheduled Meds:  Chlorhexidine Gluconate Cloth  6 each Topical Daily   influenza vac split trivalent PF  0.5 mL Intramuscular Tomorrow-1000   metroNIDAZOLE  500 mg Oral Q12H   mupirocin ointment  1 Application Nasal BID   Continuous Infusions:  ceFEPime (MAXIPIME) IV     vancomycin 1,000 mg (04/01/23 2156)   PRN Meds:.acetaminophen **OR** acetaminophen, ondansetron **OR** ondansetron (ZOFRAN) IV,  mouth rinse No Known Allergies  OBJECTIVE: Blood pressure 133/74, pulse 83, temperature 97.7 F (36.5 C), resp. rate 13, height 5\' 7"  (1.702 m), weight 94.3 kg, SpO2 99%.  Physical Exam Constitutional:      General: He is not in acute distress.    Appearance: He is normal weight. He is not toxic-appearing.  HENT:     Head: Normocephalic and atraumatic.     Right Ear: External ear normal.      Left Ear: External ear normal.  Eyes:     Extraocular Movements: Extraocular movements intact.     Conjunctiva/sclera: Conjunctivae normal.     Pupils: Pupils are equal, round, and reactive to light.  Pulmonary:     Effort: Pulmonary effort is normal.  Abdominal:     General: Abdomen is flat.  Musculoskeletal:        General: No swelling. Normal range of motion.     Cervical back: Normal range of motion.  Neurological:     General: No focal deficit present.     Mental Status: He is oriented to person, place, and time.  Psychiatric:        Mood and Affect: Mood normal.     Lab Results Lab Results  Component Value Date   WBC 4.3 04/02/2023   HGB 12.3 (L) 04/02/2023   HCT 40.1 04/02/2023   MCV 90.9 04/02/2023   PLT 305 04/02/2023    Lab Results  Component Value Date   CREATININE 0.86 04/02/2023   BUN 9 04/02/2023   NA 137 04/02/2023   K 3.6 04/02/2023   CL 102 04/02/2023   CO2 26 04/02/2023    Lab Results  Component Value Date   ALT 43 04/01/2023   AST 31 04/01/2023   ALKPHOS 52 04/01/2023   BILITOT 0.4 04/01/2023       Danelle Earthly, MD [facility] 04/02/2023, 3:30 PM I have personally spent 82 minutes involved in face-to-face and non-face-to-face activities for this patient on the day of the visit. Professional time spent includes the following activities: Preparing to see the patient (review of tests), Obtaining and/or reviewing separately obtained history (admission/discharge record), Performing a medically appropriate examination and/or evaluation , Ordering medications/tests/procedures, referring and communicating with other health care professionals, Documenting clinical information in the EMR, Independently interpreting results (not separately reported), Communicating results to the patient/family/caregiver, Counseling and educating the patient/family/caregiver and Care coordination (not separately  reported).

## 2023-04-03 DIAGNOSIS — M009 Pyogenic arthritis, unspecified: Secondary | ICD-10-CM | POA: Diagnosis not present

## 2023-04-03 DIAGNOSIS — M86179 Other acute osteomyelitis, unspecified ankle and foot: Secondary | ICD-10-CM | POA: Diagnosis not present

## 2023-04-03 MED ORDER — SODIUM CHLORIDE 0.9% FLUSH
3.0000 mL | Freq: Two times a day (BID) | INTRAVENOUS | Status: DC
  Administered 2023-04-03 – 2023-04-05 (×5): 3 mL via INTRAVENOUS

## 2023-04-03 NOTE — Progress Notes (Signed)
PROGRESS NOTE   DAWSYN RAMSARAN  LKG:[id number] DOB: [birth year] DOA: 04/01/2023 PCP: Patient, No Pcp Per   Chief Complaint  Patient presents with   Wound Check   Level of care: Med-Surg  Brief Admission History:  42 year old male with no documented chronic medical problems presenting with a 3-week history of left plantar foot wound and swelling.  The patient states that he noted a small wound on the lateral aspect of the plantar surface of his left foot about 3 weeks prior to this admission.  He had noted some swelling at that time.  He stated that he had put some type of OTC salve on his plantar wound.  He has noted some on and off swelling for the past 3 weeks, but states that the wound had not improved very much.  He began noticing some more drainage from his plantar surface on 03/29/2023.  In addition, he noted some leakage from the dorsal wound on his left foot fifth metatarsal area on 03/31/2023.  Because of continued swelling and drainage, he presented for further evaluation and treatment. Patient denies fevers, chills, headache, chest pain, dyspnea, nausea, vomiting, diarrhea, abdominal pain, dysuria, hematuria, hematochezia, and melena.  Notably, the patient had a similar wound back in January 2019.  MRI on 07/04/2017 showed septic arthritis of the left fourth MTP joint with bone destruction of the fourth distal metatarsal and fourth proximal phalanx.  At that time, the patient was taken to the OR by Dr. Fuller Canada where the patient underwent left fourth MTP joint resection and resection of the fourth toe.  In the ED, the patient was afebrile but tachycardic.  He is hemodynamically stable.  Oxygen saturation is 100% room air.  WBC 3.9, hemoglobin 13.0, platelets 301.  Sodium 130, potassium 3.8, bicarbonate 26, serum creatinine 0.92.  AST 31, ALT 43, alkaline phosphatase 52, total bilirubin 0.4.  Lactic acid 2.2. CT foot showed septic arthritis of the fifth MTP joint with osteolysis of  the distal fifth metatarsal shaft and base of the fifth phalanx.  There was underlying abscess overlying the fifth MTP joint.  There is an additional rim-enhancing fluid collection on the dorsal midfoot.  EDP contacted Ortho, Dr. Fuller Canada who agreed to consult on the patient.  MRI was requested of the left foot.   Assessment and Plan:  Acute osteomyelitis and septic arthritis of the left foot -CT foot as discussed above -MRI left foot-septic arthritis L-5th MTP with osteomyelitis of 5th met and prox 5th phalanx;  plantar abscess about 5th MTP joint and 5th metatarsal base -ESR 48 -CRP -Continue vancomycin -continue ceftriaxone -Orthopedic surgery, Dr. Fuller Canada consulted -POD#1 s/p I&D with deep wound cultures: awaiting for culture and sensitivity results  -will need ID follow up for prolonged abx after d/c   Lactic acidosis -Secondary to volume depletion and infectious process -lactate 2.2>>1.5 -Continue IV fluids>>improved  DVT prophylaxis: [place] heparin  Code Status: Full  Family Communication: none present during rounds Disposition: Status is: Inpatient   Consultants:  [facility] Romeo Apple)  Procedures:   Antimicrobials:  Cefepime/vancomycin>>   Subjective: No specific complaints, remains agreeable to home IV antibiotics.   Objective: Vitals:   04/02/23 1700 04/02/23 2125 04/03/23 0330 04/03/23 1347  BP: 134/89 127/67 124/69 123/71  Pulse: (!) 109 66 63 67  Resp: 17 18 18    Temp: 98 F (36.7 C) 98.3 F (36.8 C) 98.2 F (36.8 C) 98.4 F (36.9 C)  TempSrc: Oral Oral Oral Oral  SpO2: 99% 98%  99% 99%  Weight:      Height:        Intake/Output Summary (Last 24 hours) at 04/03/2023 1435 Last data filed at 04/03/2023 1017 Gross per 24 hour  Intake 1009.85 ml  Output 900 ml  Net 109.85 ml   Filed Weights   04/01/23 0724  Weight: 94.3 kg   Examination:  General exam: Appears calm and comfortable  Respiratory system: Clear to auscultation.  Respiratory effort normal. Cardiovascular system: normal S1 & S2 heard. No JVD, murmurs, rubs, gallops or clicks. No pedal edema. Gastrointestinal system: Abdomen is nondistended, soft and nontender. No organomegaly or masses felt. Normal bowel sounds heard. Central nervous system: Alert and oriented. No focal neurological deficits. Extremities: left foot wound bandaged clean, dry, no drainage seen.  Skin: No rashes, lesions or ulcers. Psychiatry: Judgement and insight appear normal. Mood & affect appropriate.   Data Reviewed: I have personally reviewed following labs and imaging studies  CBC: Recent Labs  Lab 04/01/23 0843 04/02/23 0507  WBC 3.9* 4.3  NEUTROABS 2.8  --   HGB 13.0 12.3*  HCT 42.6 40.1  MCV 91.6 90.9  PLT 301 305    Basic Metabolic Panel: Recent Labs  Lab 04/01/23 0843 04/02/23 0507  NA 138 137  K 3.8 3.6  CL 103 102  CO2 26 26  GLUCOSE 120* 106*  BUN 12 9  CREATININE 0.92 0.86  CALCIUM 8.5* 8.5*    CBG: Recent Labs  Lab 04/02/23 2217  GLUCAP 119*    Recent Results (from the past 240 hour(s))  Blood culture (routine x 2)     Status: None (Preliminary result)   Collection Time: 04/01/23  8:12 AM   Specimen: BLOOD  Result Value Ref Range Status   Specimen Description BLOOD LEFT ARM  Final   Special Requests   Final    BOTTLES DRAWN AEROBIC AND ANAEROBIC Blood Culture adequate volume   Culture   Final    NO GROWTH 2 DAYS Performed at [facility], [address], [place], [place] [address]    Report Status PENDING  Incomplete  Blood culture (routine x 2)     Status: None (Preliminary result)   Collection Time: 04/01/23 12:12 PM   Specimen: BLOOD  Result Value Ref Range Status   Specimen Description BLOOD BLOOD LEFT ARM  Final   Special Requests   Final    BOTTLES DRAWN AEROBIC AND ANAEROBIC Blood Culture adequate volume   Culture   Final    NO GROWTH 2 DAYS Performed at [facility], [address], [place], [place] [address]    Report  Status PENDING  Incomplete  Surgical PCR screen     Status: Abnormal   Collection Time: 04/01/23  4:14 PM   Specimen: Nasal Mucosa; Nasal Swab  Result Value Ref Range Status   MRSA, PCR NEGATIVE NEGATIVE Final   Staphylococcus aureus POSITIVE (A) NEGATIVE Final    Comment: (NOTE) The Xpert SA Assay (FDA approved for NASAL specimens in patients 64 years of age and older), is one component of a comprehensive surveillance program. It is not intended to diagnose infection nor to guide or monitor treatment. Performed at [facility], [address] [place], [place] [address]   Aerobic/Anaerobic Culture w Gram Stain (surgical/deep wound)     Status: None (Preliminary result)   Collection Time: 04/02/23 11:05 AM   Specimen: Path fluid; Body Fluid  Result Value Ref Range Status   Specimen Description   Final  FLUID Performed at [id number], [address] [place], [place] [address]    Special Requests   Final    NONE Performed at [facility], [address] [place], [place] [address]    Gram Stain   Final    RARE WBC PRESENT,BOTH PMN AND MONONUCLEAR NO ORGANISMS SEEN    Culture   Final    CULTURE REINCUBATED FOR BETTER GROWTH Performed at [facility], [address], [place], [place] [address]    Report Status PENDING  Incomplete     Radiology Studies: No results found.  Scheduled Meds:  acetaminophen  500 mg Oral Q6H   Chlorhexidine Gluconate Cloth  6 each Topical Daily   docusate sodium  100 mg Oral BID   heparin injection (subcutaneous)  5,000 Units Subcutaneous Q8H   influenza vac split trivalent PF  0.5 mL Intramuscular Tomorrow-1000   metroNIDAZOLE  500 mg Oral Q12H   mupirocin ointment  1 Application Nasal BID   sodium chloride flush  3 mL Intravenous Q12H   traMADol  50 mg Oral Q6H   Continuous Infusions:  ceFEPime (MAXIPIME) IV 2 g (04/03/23 1326)   vancomycin 1,000 mg (04/03/23 0928)     LOS: 2 days   Time spent: 55 mins  Pilot Prindle Laural Benes,  MD How to contact the [facility] Attending or Consulting provider 7A - 7P or covering provider during after hours 7P -7A, for this patient?  Check the care team in [facility] and look for a) attending/consulting TRH provider listed and b) the [facility] team listed Log into [email or web address] to find provider on call.  Locate the [facility] provider you are looking for under [facility] and page to a number that you can be directly reached. If you still have difficulty reaching the provider, please page the [facility] (Director on Call) for the Hospitalists listed on amion for assistance.  04/03/2023, 2:35 PM

## 2023-04-03 NOTE — Progress Notes (Addendum)
Patient ID: Jerome Sutton, male   DOB: 03/24/1981, 42 y.o.   MRN: [id number]   Postop day 1 status post incision and drainage left foot for abscess  BP 124/69 (BP Location: Left Arm)   Pulse 63   Temp 98.2 F (36.8 C) (Oral)   Resp 18   Ht 5\' 7"  (1.702 m)   Wt 94.3 kg   SpO2 99%   BMI 32.58 kg/m   Pain is well-controlled at this time dressing is dry  Plan is to remove the drain and change the dressing set the patient up to see foot and ankle specialist upon discharge  MODERATE WBC PRESENT, PREDOMINANTLY PMN MODERATE GRAM POSITIVE COCCI IN PAIRS IN CLUSTERS FEW GRAM NEGATIVE RODS RARE GRAM POSITIVE RODS Performed at [facility], [address] [place], [place] [address]   Culture ABUNDANT MORGANELLA MORGANII ABUNDANT PROVIDENCIA RETTGERI MIXED ANAEROBIC FLORA PRESENT.  CALL LAB IF FURTHER IID REQUIRED.  Report Status 07/09/2017 FINAL  Organism ID, Bacteria MORGANELLA MORGANII  Organism ID, Bacteria PROVIDENCIA RETTGERI  Resulting Agency [facility]     Susceptibility   Morganella morganii Providencia rettgeri    MIC MIC    AMPICILLIN RESISTANT Resistant RESISTANT Resistant    AMPICILLIN/SULBACTAM <=2 SENSITIVE Sensitive <=2 SENSITIVE Sensitive    CEFAZOLIN RESISTANT Resistant >=64 RESIST... Resistant    CEFEPIME <=1 SENSITIVE Sensitive <=1 SENSITIVE Sensitive    CEFTAZIDIME <=1 SENSITIVE Sensitive <=1 SENSITIVE Sensitive    CEFTRIAXONE <=1 SENSITIVE Sensitive <=1 SENSITIVE Sensitive    CIPROFLOXACIN <=0.25 SENS... Sensitive <=0.25 SENS... Sensitive    GENTAMICIN <=1 SENSITIVE Sensitive <=1 SENSITIVE Sensitive    IMIPENEM 1 SENSITIVE Sensitive 1 SENSITIVE Sensitive    PIP/TAZO <=4 SENSITIVE Sensitive <=4 SENSITIVE Sensitive    TRIMETH/SULFA <=20 SENSIT... Sensitive <=20 SENSIT... Sensitive

## 2023-04-03 NOTE — Plan of Care (Signed)
  Problem: Health Behavior/Discharge Planning: Goal: Ability to manage health-related needs will improve Outcome: Progressing   

## 2023-04-03 NOTE — Progress Notes (Signed)
Mobility Specialist Progress Note:    04/03/23 1200  Mobility  Activity Transferred from bed to chair  Level of Assistance Standby assist, set-up cues, supervision of patient - no hands on  Assistive Device None  Distance Ambulated (ft) 4 ft  Range of Motion/Exercises Active;All extremities  Activity Response Tolerated well  Mobility Referral Yes  $Mobility charge 1 Mobility  Mobility Specialist Start Time (ACUTE ONLY) 1200  Mobility Specialist Stop Time (ACUTE ONLY) 1215  Mobility Specialist Time Calculation (min) (ACUTE ONLY) 15 min   Pt received in bed, agreeable to mobility. Required SBA to stand and transfer to chair with no AD. Tolerated well, LLE WBAT. Left pt in chair, all needs met.   Lawerance Bach Mobility Specialist Please contact via Special educational needs teacher or  Rehab office at [phone]

## 2023-04-04 DIAGNOSIS — M86179 Other acute osteomyelitis, unspecified ankle and foot: Secondary | ICD-10-CM | POA: Diagnosis not present

## 2023-04-04 DIAGNOSIS — M009 Pyogenic arthritis, unspecified: Secondary | ICD-10-CM | POA: Diagnosis not present

## 2023-04-04 LAB — HEMOGLOBIN A1C
Hgb A1c MFr Bld: 4.9 % (ref 4.8–5.6)
Mean Plasma Glucose: 93.93 mg/dL

## 2023-04-04 LAB — VANCOMYCIN, PEAK
Vancomycin Pk: 21 ug/mL — ABNORMAL LOW (ref 30–40)
Vancomycin Pk: 18 ug/mL — ABNORMAL LOW (ref 30–40)

## 2023-04-04 NOTE — Plan of Care (Signed)

## 2023-04-04 NOTE — Progress Notes (Signed)
PROGRESS NOTE   Jerome Sutton  EXB:[id number] DOB: 1981-04-26 DOA: 04/01/2023 PCP: Patient, No Pcp Per   Chief Complaint  Patient presents with   Wound Check   Level of care: Med-Surg  Brief Admission History:  42 year old male with no documented chronic medical problems presenting with a 3-week history of left plantar foot wound and swelling.  The patient states that he noted a small wound on the lateral aspect of the plantar surface of his left foot about 3 weeks prior to this admission.  He had noted some swelling at that time.  He stated that he had put some type of OTC salve on his plantar wound.  He has noted some on and off swelling for the past 3 weeks, but states that the wound had not improved very much.  He began noticing some more drainage from his plantar surface on 03/29/2023.  In addition, he noted some leakage from the dorsal wound on his left foot fifth metatarsal area on 03/31/2023.  Because of continued swelling and drainage, he presented for further evaluation and treatment. Patient denies fevers, chills, headache, chest pain, dyspnea, nausea, vomiting, diarrhea, abdominal pain, dysuria, hematuria, hematochezia, and melena.  Notably, the patient had a similar wound back in January 2019.  MRI on 07/04/2017 showed septic arthritis of the left fourth MTP joint with bone destruction of the fourth distal metatarsal and fourth proximal phalanx.  At that time, the patient was taken to the OR by Dr. Fuller Canada where the patient underwent left fourth MTP joint resection and resection of the fourth toe.  In the ED, the patient was afebrile but tachycardic.  He is hemodynamically stable.  Oxygen saturation is 100% room air.  WBC 3.9, hemoglobin 13.0, platelets 301.  Sodium 130, potassium 3.8, bicarbonate 26, serum creatinine 0.92.  AST 31, ALT 43, alkaline phosphatase 52, total bilirubin 0.4.  Lactic acid 2.2. CT foot showed septic arthritis of the fifth MTP joint with osteolysis of  the distal fifth metatarsal shaft and base of the fifth phalanx.  There was underlying abscess overlying the fifth MTP joint.  There is an additional rim-enhancing fluid collection on the dorsal midfoot.  EDP contacted Ortho, Dr. Fuller Canada who agreed to consult on the patient.  MRI was requested of the left foot.   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 Fuller Canada consulted -POD#2 s/p I&D with deep wound cultures: awaiting for culture and sensitivity results  -will need ID follow up for prolonged abx after d/c -Pt to follow up with Dr. Romeo Apple orthopedics 1 week after discharge    Lactic acidosis -Secondary to volume depletion and infectious process -lactate 2.2>>1.5 -Continue IV fluids>>improved  DVT prophylaxis: [place] heparin  Code Status: Full  Family Communication: none present during rounds Disposition: Status is: Inpatient   Consultants:  [facility] Dr[facility] Romeo Apple)  Procedures:   Antimicrobials:  Cefepime/vancomycin>>   Subjective: Pt ok with having to wait for culture results, says he wants it treated "right."   Objective: Vitals:   04/03/23 1347 04/03/23 2008 04/04/23 0309 04/04/23 1253  BP: 123/71 (!) 99/56 132/83 109/74  Pulse: 67 65 79 (!) 55  Resp:  18 18 20   Temp: 98.4 F (36.9 C) 98.2 F (36.8 C) 98.2 F (36.8 C) 98.6 F (37 C)  TempSrc: Oral Oral  Oral  SpO2: 99% 100% 99% 99%  Weight:      Height:        Intake/Output Summary (Last 24 hours) at 04/04/2023 1257 Last data filed at 04/04/2023 0500 Gross per 24 hour  Intake 1623.02 ml  Output  800 ml  Net 823.02 ml   Filed Weights   04/01/23 0724  Weight: 94.3 kg   Examination:  General exam: Appears calm and comfortable  Respiratory system: Clear to auscultation. Respiratory effort normal. Cardiovascular system: normal S1 & S2 heard. No JVD, murmurs, rubs, gallops or clicks. No pedal edema. Gastrointestinal system: Abdomen is nondistended, soft and nontender. No organomegaly or masses felt. Normal bowel sounds heard. Central nervous system: Alert and oriented. No focal neurological deficits. Extremities: left foot wound bandaged clean, dry, no drainage seen.  Skin: No rashes, lesions or ulcers. Psychiatry: Judgement and insight appear normal. Mood & affect appropriate.   Data Reviewed: I have personally reviewed following labs and imaging studies  CBC: Recent Labs  Lab 04/01/23 0843 04/02/23 0507  WBC 3.9* 4.3  NEUTROABS 2.8  --   HGB 13.0 12.3*  HCT 42.6 40.1  MCV 91.6 90.9  PLT 301 305    Basic Metabolic Panel: Recent Labs  Lab 04/01/23 0843 04/02/23 0507  NA 138 137  K 3.8 3.6  CL 103 102  CO2 26 26  GLUCOSE 120* 106*  BUN 12 9  CREATININE 0.92 0.86  CALCIUM 8.5* 8.5*    CBG: Recent Labs  Lab 04/02/23 2217  GLUCAP 119*    Recent Results (from the past 240 hour(s))  Blood culture (routine x 2)     Status: None (Preliminary result)   Collection Time: 04/01/23  8:12 AM   Specimen: BLOOD  Result Value Ref Range Status   Specimen Description BLOOD LEFT ARM  Final   Special Requests   Final    BOTTLES DRAWN AEROBIC AND ANAEROBIC Blood Culture adequate volume   Culture   Final    NO GROWTH 3 DAYS Performed at [facility], [address], [place], [place] [address]    Report Status PENDING  Incomplete  Blood culture (routine x 2)     Status: None (Preliminary result)   Collection Time: 04/01/23 12:12 PM   Specimen: BLOOD  Result Value Ref Range Status   Specimen Description BLOOD BLOOD LEFT ARM  Final   Special Requests   Final     BOTTLES DRAWN AEROBIC AND ANAEROBIC Blood Culture adequate volume   Culture   Final    NO GROWTH 3 DAYS Performed at [facility], [address] [place], [place] [address]    Report Status PENDING  Incomplete  Surgical PCR screen     Status: Abnormal   Collection Time: 04/01/23  4:14 PM   Specimen: Nasal Mucosa; Nasal Swab  Result Value Ref Range Status   MRSA, PCR NEGATIVE NEGATIVE Final   Staphylococcus aureus POSITIVE (A) NEGATIVE Final    Comment: (NOTE) The Xpert SA Assay (FDA approved for NASAL specimens in patients 58 years of age and older), is one component of a comprehensive surveillance program. It is not intended to diagnose infection nor to guide or monitor treatment. Performed at [facility], [address] [place], [place] [address]   Aerobic/Anaerobic Culture w Gram Stain (  surgical/deep wound)     Status: None (Preliminary result)   Collection Time: 04/02/23 11:05 AM   Specimen: Path fluid; Body Fluid  Result Value Ref Range Status   Specimen Description   Final    FLUID Performed at [facility], [address], [place], [place] [address]    Special Requests   Final    NONE Performed at [facility], [address], [place], [place] [address]    Gram Stain   Final    RARE WBC PRESENT,BOTH PMN AND MONONUCLEAR NO ORGANISMS SEEN Performed at [facility], [address] [place], [place] [address]    Culture   Final    FEW STAPHYLOCOCCUS AUREUS SUSCEPTIBILITIES TO FOLLOW CULTURE REINCUBATED FOR BETTER GROWTH NO ANAEROBES ISOLATED; CULTURE IN PROGRESS FOR 5 DAYS    Report Status PENDING  Incomplete     Radiology Studies: No results found.  Scheduled Meds:  Chlorhexidine Gluconate Cloth  6 each Topical Daily   docusate sodium  100 mg Oral BID   heparin injection (subcutaneous)  5,000 Units Subcutaneous Q8H   influenza vac split trivalent PF  0.5 mL Intramuscular Tomorrow-1000   metroNIDAZOLE  500 mg Oral Q12H   mupirocin ointment  1 Application  Nasal BID   sodium chloride flush  3 mL Intravenous Q12H   traMADol  50 mg Oral Q6H   Continuous Infusions:  ceFEPime (MAXIPIME) IV 2 g (04/04/23 0547)   vancomycin 1,000 mg (04/04/23 0918)     LOS: 3 days   Time spent: 40 mins  Jerome Vanriper Laural Benes, MD How to contact the [facility] Attending or Consulting provider 7A - 7P or covering provider during after hours 7P -7A, for this patient?  Check the care team in [facility] and look for a) attending/consulting [facility] provider listed and b) the [facility] team listed Log into [email or web address] to find provider on call.  Locate the [facility] provider you are looking for under [facility] and page to a number that you can be directly reached. If you still have difficulty reaching the provider, please page the [facility] (Director on Call) for the Hospitalists listed on amion for assistance.  04/04/2023, 12:57 PM

## 2023-04-04 NOTE — Progress Notes (Signed)
Pharmacy Antibiotic Note  Jerome Sutton is a 42 y.o. male admitted on 04/01/2023 with  foot wound .  Pharmacy has been consulted for vancomycin and zosyn dosing.  Patient currently afebrile, wbc wnl. Renal function normal.   Vanc peak done last night was 21. Morning dose was hung prior to trough being drawn. Will retime new levels for today.   Plan:  Continue Vancomycin 1000 mg IV Q 12 hrs. Goal AUC 400-550. Expected AUC: 477 SCr used: 0.9 Continue cefepime 2g q8 hours  Recheck Peak today and trough tonight  Height: 5\' 7"  (170.2 cm) Weight: 94.3 kg (208 lb) IBW/kg (Calculated) : 66.1  Temp (24hrs), Avg:98.3 F (36.8 C), Min:98.2 F (36.8 C), Max:98.4 F (36.9 C)  Recent Labs  Lab 04/01/23 0843 04/01/23 1046 04/02/23 0507 04/04/23 0001  WBC 3.9*  --  4.3  --   CREATININE 0.92  --  0.86  --   LATICACIDVEN 2.2* 1.5  --   --   VANCOPEAK  --   --   --  21*    Estimated Creatinine Clearance: 125 mL/min (by C-G formula based on SCr of 0.86 mg/dL).    No Known Allergies  Thank you for allowing pharmacy to be a part of this patient's care.  Sheppard Coil PharmD., BCPS Clinical Pharmacist 04/04/2023 12:45 PM

## 2023-04-04 NOTE — Progress Notes (Signed)
Subjective: 2 Days Post-Op Procedure(s) (LRB): INCISION AND DRAINAGE ABSCESS (Left) Patient reports pain as 1 on 0-10 scale.    Objective: Vital signs in last 24 hours: Temp:  [98.2 F (36.8 C)-98.4 F (36.9 C)] 98.2 F (36.8 C) (10/10 0309) Pulse Rate:  [65-79] 79 (10/10 0309) Resp:  [18] 18 (10/10 0309) BP: (99-132)/(56-83) 132/83 (10/10 0309) SpO2:  [99 %-100 %] 99 % (10/10 0309)  Intake/Output from previous day: 10/09 0701 - 10/10 0700 In: 1863 [P.O.:960; IV Piggyback:903] Out: 1700 [Urine:1700] Intake/Output this shift: No intake/output data recorded.  Recent Labs    04/01/23 0843 04/02/23 0507  HGB 13.0 12.3*   Recent Labs    04/01/23 0843 04/02/23 0507  WBC 3.9* 4.3  RBC 4.65 4.41  HCT 42.6 40.1  PLT 301 305   Recent Labs    04/01/23 0843 04/02/23 0507  NA 138 137  K 3.8 3.6  CL 103 102  CO2 26 26  BUN 12 9  CREATININE 0.92 0.86  GLUCOSE 120* 106*  CALCIUM 8.5* 8.5*   No results for input(s): "LABPT", "INR" in the last 72 hours.  Neurologically intact Dressing was changed wound looks clean     Assessment/Plan: 2 Days Post-Op Procedure(s) (LRB): INCISION AND DRAINAGE ABSCESS (Left) Dressing change daily with saline gauze Ace bandage  Weightbearing as tolerated through the heel  Follow-up with orthopedics 1 week after discharge      Fuller Canada 04/04/2023, 7:47 AM

## 2023-04-05 ENCOUNTER — Encounter (HOSPITAL_COMMUNITY): Payer: Self-pay | Admitting: Pharmacy Technician

## 2023-04-05 ENCOUNTER — Other Ambulatory Visit (HOSPITAL_COMMUNITY): Payer: Self-pay

## 2023-04-05 DIAGNOSIS — M009 Pyogenic arthritis, unspecified: Secondary | ICD-10-CM | POA: Diagnosis not present

## 2023-04-05 DIAGNOSIS — M86179 Other acute osteomyelitis, unspecified ankle and foot: Secondary | ICD-10-CM | POA: Diagnosis not present

## 2023-04-05 LAB — VANCOMYCIN, TROUGH: Vancomycin Tr: 7 ug/mL — ABNORMAL LOW (ref 15–20)

## 2023-04-05 MED ORDER — LINEZOLID 600 MG PO TABS
600.0000 mg | ORAL_TABLET | Freq: Two times a day (BID) | ORAL | Status: DC
  Filled 2023-04-05 (×7): qty 1

## 2023-04-05 MED ORDER — LINEZOLID 600 MG PO TABS: 600.0000 mg | ORAL_TABLET | Freq: Two times a day (BID) | ORAL | 0 refills | Status: DC

## 2023-04-05 MED ORDER — CEFADROXIL 500 MG PO CAPS: 1000.0000 mg | ORAL_CAPSULE | Freq: Two times a day (BID) | ORAL | Status: DC

## 2023-04-05 MED ORDER — VANCOMYCIN HCL 1500 MG/300ML IV SOLN
1500.0000 mg | Freq: Two times a day (BID) | INTRAVENOUS | Status: DC
  Administered 2023-04-05: 1500 mg via INTRAVENOUS
  Filled 2023-04-05: qty 300

## 2023-04-05 MED ORDER — CEFADROXIL 500 MG PO CAPS: 1000.0000 mg | ORAL_CAPSULE | Freq: Two times a day (BID) | ORAL | 0 refills | Status: DC

## 2023-04-05 MED ORDER — ACETAMINOPHEN 325 MG PO TABS: 650.0000 mg | ORAL_TABLET | Freq: Four times a day (QID) | ORAL | Status: DC | PRN

## 2023-04-05 NOTE — TOC Benefit Eligibility Note (Signed)
Patient Product/process development scientist completed.    The patient is insured through [employer]. Patient has [employer], may use a copay card, and/or apply for patient assistance if available.    Ran test claim for linezolid (Zyvox) 600 mg and the current 14 day co-pay is $100.00.   This test claim was processed through [facility]- copay amounts may vary at other pharmacies due to pharmacy/plan contracts, or as the patient moves through the different stages of their insurance plan.     Roland Earl, CPHT Pharmacy Technician III Certified Patient Advocate [facility] Pharm[facility] Patient Advocate Team Direct Number: [phone]  Fax: [phone]

## 2023-04-05 NOTE — Anesthesia Postprocedure Evaluation (Signed)
Anesthesia Post Note  Patient: Jerome Sutton  Procedure(s) Performed: INCISION AND DRAINAGE ABSCESS (Left: Foot)  Patient location during evaluation: Phase II Anesthesia Type: General Level of consciousness: awake Pain management: pain level controlled Vital Signs Assessment: post-procedure vital signs reviewed and stable Respiratory status: spontaneous breathing and respiratory function stable Cardiovascular status: blood pressure returned to baseline and stable Postop Assessment: no headache and no apparent nausea or vomiting Anesthetic complications: no Comments: Late entry   No notable events documented.   Last Vitals:  Vitals:   04/04/23 2111 04/05/23 0422  BP: 114/66 115/69  Pulse: 63 (!) 58  Resp: 20 18  Temp: 37 C (!) 36.4 C  SpO2: 100% 99%    Last Pain:  Vitals:   04/05/23 0800  TempSrc:   PainSc: 0-No pain                 Windell Norfolk

## 2023-04-05 NOTE — Discharge Summary (Signed)
Physician Discharge Summary  Jerome Sutton NGE:[id number] DOB: [birth year] DOA: 04/01/2023  PCP: Patient, No Pcp Per Orthopedic surgeon: Dr. Romeo Apple Infectious Disease:  [facility])   Admit date: 04/01/2023 Discharge date: 04/05/2023  Admitted From:  HOME  Disposition:  HOME   Recommendations for Outpatient Follow-up:  Follow up with Dr. Romeo Apple orthopedics in 1 weeks Follow up with Infectious Disease Clinic on 04/19/23 as scheduled  Pt to complete Zyvox 10/11 - 10/25, then start cefadroxil 10/25-11/19  Discharge Condition: STABLE   CODE STATUS: FULL DIET: resume home diet    Brief Hospitalization Summary: Please see all hospital notes, images, labs for full details of the hospitalization. Admission Provider HPI:  42 year old male with no documented chronic medical problems presenting with a 3-week history of left plantar foot wound and swelling.  The patient states that he noted a small wound on the lateral aspect of the plantar surface of his left foot about 3 weeks prior to this admission.  He had noted some swelling at that time.  He stated that he had put some type of OTC salve on his plantar wound.  He has noted some on and off swelling for the past 3 weeks, but states that the wound had not improved very much.  He began noticing some more drainage from his plantar surface on 03/29/2023.  In addition, he noted some leakage from the dorsal wound on his left foot fifth metatarsal area on 03/31/2023.  Because of continued swelling and drainage, he presented for further evaluation and treatment. Patient denies fevers, chills, headache, chest pain, dyspnea, nausea, vomiting, diarrhea, abdominal pain, dysuria, hematuria, hematochezia, and melena.  Notably, the patient had a similar wound back in January 2019.  MRI on 07/04/2017 showed septic arthritis of the left fourth MTP joint with bone destruction of the fourth distal metatarsal and fourth proximal phalanx.  At that time, the  patient was taken to the OR by Dr. Fuller Canada where the patient underwent left fourth MTP joint resection and resection of the fourth toe.  In the ED, the patient was afebrile but tachycardic.  He is hemodynamically stable.  Oxygen saturation is 100% room air.  WBC 3.9, hemoglobin 13.0, platelets 301.  Sodium 130, potassium 3.8, bicarbonate 26, serum creatinine 0.92.  AST 31, ALT 43, alkaline phosphatase 52, total bilirubin 0.4.  Lactic acid 2.2. CT foot showed septic arthritis of the fifth MTP joint with osteolysis of the distal fifth metatarsal shaft and base of the fifth phalanx.  There was underlying abscess overlying the fifth MTP joint.  There is an additional rim-enhancing fluid collection on the dorsal midfoot.  EDP contacted Ortho, Dr. Fuller Canada who agreed to consult on the patient.  MRI was requested of the left foot.  HOSPITAL COURSE BY PROBLEM   Acute osteomyelitis and septic arthritis of the left foot -MRI left foot-septic arthritis L-5th MTP with osteomyelitis of 5th met and prox 5th phalanx;  plantar abscess about 5th MTP joint and 5th metatarsal base -treated with vancomycin and ceftriaxone pending deep wound culture ID and sensitivities -Orthopedic surgery, Dr. Fuller Canada consulted -POD#3 s/p I&D with deep wound cultures: staph aureus and staph epidermidis   -discussed with Dr. Thedore Mins with ID service and recommendation is for zyvox 600 mg BID x 2 weeks then cefadroxil BID 10/25-11/19 to complete treatment, follow up arranged at [facility] on 04/19/23.  Pt notified and verbalized understanding and written instructions provided for patient at discharge -Pt to follow up with Dr. Romeo Apple orthopedics  1 week after discharge  -Pt to follow up with [facility] clinic on 04/19/23    Sepsis secondary to osteomyelitis / Lactic acidosis -Secondary to volume depletion and infectious process -lactate 2.2>>1.5 -treated with IV fluids>>sepsis physiology resolved   Discharge  Diagnoses:  Principal Problem:   Acute osteomyelitis of foot (HCC) Active Problems:   Septic arthritis of left foot [facility])   Discharge Instructions:  Allergies as of 04/05/2023   No Known Allergies      Medication List     TAKE these medications    acetaminophen 325 MG tablet Commonly known as: TYLENOL Take 2 tablets (650 mg total) by mouth every 6 (six) hours as needed for mild pain (or Fever >/= 101).   cefadroxil 500 MG capsule Commonly known as: DURICEF Take 2 capsules (1,000 mg total) by mouth 2 (two) times daily for 50 doses. Start taking on: April 19, 2023   linezolid 600 MG tablet Commonly known as: ZYVOX Take 1 tablet (600 mg total) by mouth every 12 (twelve) hours for 28 doses.        Follow-up Information     Odette Fraction, MD. Go on 04/19/2023.   Specialty: Infectious Diseases Why: Hospital Follow Up as scheduled Contact information: [address] [place] [address] [phone]         Vickki Hearing, MD. Schedule an appointment as soon as possible for a visit in 1 week(s).   Specialties: Orthopedic Surgery, Radiology Why: Hospital Follow Up Contact information: [address] [place] [address] [phone]                No Known Allergies Allergies as of 04/05/2023   No Known Allergies      Medication List     TAKE these medications    acetaminophen 325 MG tablet Commonly known as: TYLENOL Take 2 tablets (650 mg total) by mouth every 6 (six) hours as needed for mild pain (or Fever >/= 101).   cefadroxil 500 MG capsule Commonly known as: DURICEF Take 2 capsules (1,000 mg total) by mouth 2 (two) times daily for 50 doses. Start taking on: April 19, 2023   linezolid 600 MG tablet Commonly known as: ZYVOX Take 1 tablet (600 mg total) by mouth every 12 (twelve) hours for 28 doses.        Procedures/Studies: MR FOOT LEFT W WO CONTRAST  Result Date: 04/01/2023 CLINICAL DATA:   Soft tissue infection suspected.  Osteomyelitis. EXAM: MRI OF THE LEFT FOREFOOT WITHOUT AND WITH CONTRAST TECHNIQUE: Multiplanar, multisequence MR imaging of the left forefoot was performed both before and after administration of intravenous contrast. CONTRAST:  10mL GADAVIST GADOBUTROL 1 MMOL/ML IV SOLN COMPARISON:  Radiographs 04/01/2023 and 07/03/2017. CT 04/01/2023 and MRI 07/04/2017. FINDINGS: Bones/Joint/Cartilage As shown on earlier radiographs and CT, there is septic arthritis at the 5th metatarsophalangeal joint with acute osteomyelitis involving the 5th metatarsal and base of the 5th proximal phalanx. Marrow T2 hyperintensity and enhancement extends into the base of the 5th metatarsal. 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 No evidence of osteomyelitis within the 1st through 3rd rays or visualized bones of the midfoot. Chronic deformities of the 2nd and 3rd toes. Evaluation of the proximal phalanx of the great toe is limited by artifact from the linear metallic foreign body seen on the earlier radiographs. Ligaments Intact Lisfranc ligament. The collateral ligaments of the  remaining metatarsophalangeal joints are intact. Muscles and Tendons Generalized forefoot muscular atrophy and edema. No significant tenosynovitis identified. Soft tissues Soft tissue ulceration along the plantar aspect of the 5th metatarsophalangeal joint with underlying ill-defined, peripherally enhancing fluid collection surrounding the destroyed joint. Apparent additional area of soft tissue ulceration dorsally with a peripherally enhancing fluid collection dorsal to the 4th metatarsal remnant, measuring up to 1.9 cm on image 20/13. There is a 3rd peripherally enhancing fluid collection along the lateral and plantar aspect of the 5th metatarsal base which measures  up to 1.3 cm on image 8/13. No obvious soft tissue ulceration identified in this region. IMPRESSION: 1. Septic arthritis at the 5th metatarsophalangeal joint with acute osteomyelitis involving the 5th metatarsal and base of the 5th proximal phalanx. 2. Soft tissue ulceration along the plantar aspect of the 5th metatarsophalangeal joint with underlying ill-defined, peripherally enhancing fluid collection surrounding the destroyed joint, consistent with an abscess. 3. Additional soft tissue ulceration dorsally with peripherally enhancing fluid collection dorsal to the 4th metatarsal remnant, measuring up to 1.9 cm. This could reflect a postsurgical fluid collection, although suspicious for an additional abscess. 4. 3rd peripherally enhancing fluid collection along the lateral and plantar aspect of the 5th metatarsal base measuring up to 1.3 cm. 5. 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 6. Chronic deformities of the 2nd and 3rd toes. Evaluation of the proximal phalanx of the great toe is limited by artifact from the linear metallic foreign body seen on the earlier radiographs. Electronically Signed   By: Carey Bullocks M.D.   On: 04/01/2023 13:48   DG Foot Complete Left  Result Date: 04/01/2023 CLINICAL DATA:  [id number] Abscess [id number] EXAM: LEFT FOOT - COMPLETE 3+ VIEW COMPARISON:  Same-day CT of the left foot at 8:43 a.m. FINDINGS: Plantar soft tissue wound of the forefoot at the level of fifth MTP articulation. Additional region of subcutaneous edema noted at the lateral midfoot, overlying the base of fifth metatarsal. There is essentially complete destruction of fifth MTP joint with osteolysis involving the remnant fifth distal metatarsal shaft and base of fifth proximal phalanx. Redemonstration of sequela of prior septic arthritis of fourth MTP  joint with chronic appearing bony destruction and possible postsurgical changes of the distal fourth metatarsal and base of fourth proximal phalanx. Severe dorsiflexion is again noted at the second and third MTP joints. There is a 9 mm linear radiopaque foreign body projecting in the lateral soft tissues of the first proximal phalanx. IMPRESSION: 1. Septic arthritis of fifth MTP joint with findings concerning for osteomyelitis of the fifth distal metatarsal shaft and base of the fifth proximal phalanx with an overlying plantar soft tissue wound. 2. Sequela of prior septic arthritis of the fourth MTP joint. 3. 9 mm linear radiopaque foreign body in the lateral soft tissues of the first proximal phalanx. Electronically Signed   By: Hart Robinsons M.D.   On: 04/01/2023 13:09   CT Foot Left Wo Contrast  Result Date: 04/01/2023 CLINICAL DATA:  Osteomyelitis suspected, foot, xray done h/ osteomyelitis, r/o nec fasc. EXAM: CT OF THE LEFT FOOT WITHOUT CONTRAST TECHNIQUE: Multidetector CT imaging of the left foot was performed according to the standard protocol. Multiplanar CT image reconstructions were also generated. RADIATION DOSE REDUCTION: This exam was performed according to the departmental dose-optimization program which includes automated exposure control, adjustment of the mA and/or kV according to patient size  and/or use of iterative reconstruction technique. COMPARISON:  MRI left foot dated July 04, 2017. FINDINGS: Bones/Joint/Cartilage There has been complete destruction of the fifth MTP joint with regions of cortical indistinctness and osteolysis involving the remnant fifth distal metatarsal metaphyseal shaft and base of the fifth proximal phalanx. Punctate curvilinear foci of mineralization within the region of the fifth MTP articulation. The remnant base of fifth proximal phalanx is subluxed dorsally relative to the remnant fifth distal metatarsal shaft. Redemonstration of sequela of prior septic  arthritis of the fourth MTP joint with associated chronic appearing bony destruction and possible postsurgical changes of the distal fourth metatarsal and base of fourth proximal phalanx. No definite evidence of new osteolysis or periostitis involving the remnant fourth metatarsal shaft and fourth proximal phalanx. There is pronounced dorsiflexion noted at the second and third MTP joints. No additional areas of discernible osteolysis or periostitis identified. Punctate ossicles at the base of the proximal cuboid, may relate to prior trauma, however, the more dominant sized ossicle may represent an os peroneum. The visualized hindfoot and midfoot articulations are intact. Ligaments Suboptimally assessed by CT. Soft tissues There is a plantar soft tissue wound of the forefoot at the level of the fifth MTP joint which tracks deep to communicate with a peripherally enhancing fluid collection, with extension into the region of the bony destructive changes of the fifth MTP joint, as described above. This collection measures 2.6 cm transverse x 2.7 cm AP x 1.6 cm craniocaudal (series 7, image 96 and series 9, images 123 and 124). There is surrounding edema and infiltration of the subcutaneous fat with overlying cutaneous thickening. No soft tissue gas. Cutaneous thickening is noted involving the soft tissues of the lateral midfoot, overlying the base of fifth metatarsal. There is underlying subcutaneous edema and ill-defined fluid with a loculated irregular peripherally enhancing fluid collection measuring 1.1 cm transverse x 0.6 cm AP x 0.6 cm craniocaudal (series 7, image 155 and series 9, image 126). There is a focal region of cutaneous thickening overlying the head of third metatarsal with mild surrounding edema. No discrete fluid collection is identified in this region. There is dorsal and lateral subcutaneous edema of the midfoot and forefoot. IMPRESSION: 1. Septic arthritis of the fifth MTP joint with osteolysis of  the fifth distal metatarsal shaft and base of fifth proximal phalanx, concerning for osteomyelitis. There is an underlying abscess, with measurements as above, which communicates with a plantar soft tissue wound overlying the fifth MTP joint and surrounding cellulitis. 2. Additional rim enhancing fluid collection, favored to represent an abscess, involving the soft tissues of the lateral midfoot, overlying the base of fifth metatarsal. Cellulitis of the dorsal and lateral midfoot through forefoot. 3. Focal cutaneous thickening overlying the head of the third metatarsal with mild surrounding edema could represent cellulitis. 4. Sequela of prior septic arthritis of the fourth MTP joint with associated chronic appearing bony destruction and possible postoperative changes of the distal fourth metatarsal and base of the fourth proximal phalanx without definite evidence of new osteolysis or periostitis. Electronically Signed   By: Hart Robinsons M.D.   On: 04/01/2023 09:56     Subjective: Pt without complaints.    Discharge Exam: Vitals:   04/05/23 0422 04/05/23 1228  BP: 115/69 111/61  Pulse: (!) 58 61  Resp: 18 18  Temp: (!) 97.5 F (36.4 C) 98.4 F (36.9 C)  SpO2: 99% 99%   Vitals:   04/04/23 1253 04/04/23 2111 04/05/23 0422 04/05/23 1228  BP: 109/74  114/66 115/69 111/61  Pulse: (!) 55 63 (!) 58 61  Resp: 20 20 18 18   Temp: 98.6 F (37 C) 98.6 F (37 C) (!) 97.5 F (36.4 C) 98.4 F (36.9 C)  TempSrc: Oral  Oral Oral  SpO2: 99% 100% 99% 99%  Weight:      Height:       General exam: Appears calm and comfortable  Respiratory system: Clear to auscultation. Respiratory effort normal. Cardiovascular system: normal S1 & S2 heard. No JVD, murmurs, rubs, gallops or clicks. No pedal edema. Gastrointestinal system: Abdomen is nondistended, soft and nontender. No organomegaly or masses felt. Normal bowel sounds heard. Central nervous system: Alert and oriented. No focal neurological  deficits. Extremities: left foot wound bandaged clean, dry, no drainage seen.  Skin: No rashes, lesions or ulcers. Psychiatry: Judgement and insight appear normal. Mood & affect appropriate.   The results of significant diagnostics from this hospitalization (including imaging, microbiology, ancillary and laboratory) are listed below for reference.     Microbiology: Recent Results (from the past 240 hour(s))  Blood culture (routine x 2)     Status: None (Preliminary result)   Collection Time: 04/01/23  8:12 AM   Specimen: BLOOD  Result Value Ref Range Status   Specimen Description BLOOD LEFT ARM  Final   Special Requests   Final    BOTTLES DRAWN AEROBIC AND ANAEROBIC Blood Culture adequate volume   Culture   Final    NO GROWTH 4 DAYS Performed at [facility], [address] [place], [place] [address]    Report Status PENDING  Incomplete  Blood culture (routine x 2)     Status: None (Preliminary result)   Collection Time: 04/01/23 12:12 PM   Specimen: BLOOD  Result Value Ref Range Status   Specimen Description BLOOD BLOOD LEFT ARM  Final   Special Requests   Final    BOTTLES DRAWN AEROBIC AND ANAEROBIC Blood Culture adequate volume   Culture   Final    NO GROWTH 4 DAYS Performed at [facility], [address], [place], [place] [address]    Report Status PENDING  Incomplete  Surgical PCR screen     Status: Abnormal   Collection Time: 04/01/23  4:14 PM   Specimen: Nasal Mucosa; Nasal Swab  Result Value Ref Range Status   MRSA, PCR NEGATIVE NEGATIVE Final   Staphylococcus aureus POSITIVE (A) NEGATIVE Final    Comment: (NOTE) The Xpert SA Assay (FDA approved for NASAL specimens in patients 23 years of age and older), is one component of a comprehensive surveillance program. It is not intended to diagnose infection nor to guide or monitor treatment. Performed at [facility], [address] [place], [place] [address]   Aerobic/Anaerobic Culture w Gram Stain (surgical/deep  wound)     Status: None (Preliminary result)   Collection Time: 04/02/23 11:05 AM   Specimen: Path fluid; Body Fluid  Result Value Ref Range Status   Specimen Description   Final    FLUID Performed at [facility], [address] [place], [place] [address]    Special Requests   Final    NONE Performed at [facility], [address] [place], [place] [address]    Gram Stain   Final    RARE WBC PRESENT,BOTH PMN AND MONONUCLEAR NO ORGANISMS SEEN Performed at [facility], [address] [place], [place] [address]    Culture   Final    FEW STAPHYLOCOCCUS AUREUS FEW STAPHYLOCOCCUS EPIDERMIDIS NO ANAEROBES  ISOLATED; CULTURE IN PROGRESS FOR 5 DAYS    Report Status PENDING  Incomplete   Organism ID, Bacteria STAPHYLOCOCCUS AUREUS  Final   Organism ID, Bacteria STAPHYLOCOCCUS EPIDERMIDIS  Final      Susceptibility   Staphylococcus aureus - MIC*    CIPROFLOXACIN <=0.5 SENSITIVE Sensitive     ERYTHROMYCIN 4 INTERMEDIATE Intermediate     GENTAMICIN <=0.5 SENSITIVE Sensitive     OXACILLIN <=0.25 SENSITIVE Sensitive     TETRACYCLINE 8 INTERMEDIATE Intermediate     VANCOMYCIN <=0.5 SENSITIVE Sensitive     TRIMETH/SULFA <=10 SENSITIVE Sensitive     CLINDAMYCIN <=0.25 SENSITIVE Sensitive     RIFAMPIN <=0.5 SENSITIVE Sensitive     Inducible Clindamycin NEGATIVE Sensitive     LINEZOLID 2 SENSITIVE Sensitive     * FEW STAPHYLOCOCCUS AUREUS   Staphylococcus epidermidis - MIC*    CIPROFLOXACIN <=0.5 SENSITIVE Sensitive     ERYTHROMYCIN >=8 RESISTANT Resistant     GENTAMICIN <=0.5 SENSITIVE Sensitive     OXACILLIN <=0.25 SENSITIVE Sensitive     TETRACYCLINE 2 SENSITIVE Sensitive     VANCOMYCIN 1 SENSITIVE Sensitive     TRIMETH/SULFA 160 RESISTANT Resistant     CLINDAMYCIN <=0.25 SENSITIVE Sensitive     RIFAMPIN <=0.5 SENSITIVE Sensitive     Inducible Clindamycin NEGATIVE Sensitive     * FEW STAPHYLOCOCCUS EPIDERMIDIS     Labs: BNP (last 3 results) No results for input(s): "BNP"  in the last 8760 hours. Basic Metabolic Panel: Recent Labs  Lab 04/01/23 0843 04/02/23 0507  NA 138 137  K 3.8 3.6  CL 103 102  CO2 26 26  GLUCOSE 120* 106*  BUN 12 9  CREATININE 0.92 0.86  CALCIUM 8.5* 8.5*   Liver Function Tests: Recent Labs  Lab 04/01/23 0843  AST 31  ALT 43  ALKPHOS 52  BILITOT 0.4  PROT 7.9  ALBUMIN 3.2*   No results for input(s): "LIPASE", "AMYLASE" in the last 168 hours. No results for input(s): "AMMONIA" in the last 168 hours. CBC: Recent Labs  Lab 04/01/23 0843 04/02/23 0507  WBC 3.9* 4.3  NEUTROABS 2.8  --   HGB 13.0 12.3*  HCT 42.6 40.1  MCV 91.6 90.9  PLT 301 305   Cardiac Enzymes: No results for input(s): "CKTOTAL", "CKMB", "CKMBINDEX", "TROPONINI" in the last 168 hours. BNP: Invalid input(s): "POCBNP" CBG: Recent Labs  Lab 04/02/23 2217  GLUCAP 119*   D-Dimer No results for input(s): "DDIMER" in the last 72 hours. Hgb A1c No results for input(s): "HGBA1C" in the last 72 hours. Lipid Profile No results for input(s): "CHOL", "HDL", "LDLCALC", "TRIG", "CHOLHDL", "LDLDIRECT" in the last 72 hours. Thyroid function studies No results for input(s): "TSH", "T4TOTAL", "T3FREE", "THYROIDAB" in the last 72 hours.  Invalid input(s): "FREET3" Anemia work up No results for input(s): "VITAMINB12", "FOLATE", "FERRITIN", "TIBC", "IRON", "RETICCTPCT" in the last 72 hours. Urinalysis    Component Value Date/Time   COLORURINE YELLOW 02/18/2012 1602   APPEARANCEUR CLEAR 02/18/2012 1602   LABSPEC 1.028 02/18/2012 1602   PHURINE 6.0 02/18/2012 1602   GLUCOSEU NEGATIVE 02/18/2012 1602   HGBUR NEGATIVE 02/18/2012 1602   BILIRUBINUR NEGATIVE 02/18/2012 1602   KETONESUR 15 (A) 02/18/2012 1602   PROTEINUR NEGATIVE 02/18/2012 1602   UROBILINOGEN 1.0 02/18/2012 1602   NITRITE NEGATIVE 02/18/2012 1602   LEUKOCYTESUR NEGATIVE 02/18/2012 1602   Sepsis Labs Recent Labs  Lab 04/01/23 0843 04/02/23 0507  WBC 3.9* 4.3    Microbiology Recent Results (from the past 240  hour(s))  Blood culture (routine x 2)     Status: None (Preliminary result)   Collection Time: 04/01/23  8:12 AM   Specimen: BLOOD  Result Value Ref Range Status   Specimen Description BLOOD LEFT ARM  Final   Special Requests   Final    BOTTLES DRAWN AEROBIC AND ANAEROBIC Blood Culture adequate volume   Culture   Final    NO GROWTH 4 DAYS Performed at [facility], [address] [place], [place] [address]    Report Status PENDING  Incomplete  Blood culture (routine x 2)     Status: None (Preliminary result)   Collection Time: 04/01/23 12:12 PM   Specimen: BLOOD  Result Value Ref Range Status   Specimen Description BLOOD BLOOD LEFT ARM  Final   Special Requests   Final    BOTTLES DRAWN AEROBIC AND ANAEROBIC Blood Culture adequate volume   Culture   Final    NO GROWTH 4 DAYS Performed at [facility], [address], [place], [place] [address]    Report Status PENDING  Incomplete  Surgical PCR screen     Status: Abnormal   Collection Time: 04/01/23  4:14 PM   Specimen: Nasal Mucosa; Nasal Swab  Result Value Ref Range Status   MRSA, PCR NEGATIVE NEGATIVE Final   Staphylococcus aureus POSITIVE (A) NEGATIVE Final    Comment: (NOTE) The Xpert SA Assay (FDA approved for NASAL specimens in patients 50 years of age and older), is one component of a comprehensive surveillance program. It is not intended to diagnose infection nor to guide or monitor treatment. Performed at [facility], [address] [place], [place] [address]   Aerobic/Anaerobic Culture w Gram Stain (surgical/deep wound)     Status: None (Preliminary result)   Collection Time: 04/02/23 11:05 AM   Specimen: Path fluid; Body Fluid  Result Value Ref Range Status   Specimen Description   Final    FLUID Performed at [facility], [address] [place], [place] [address]    Special Requests   Final    NONE Performed at [facility], [address] [place],  [place] [address]    Gram Stain   Final    RARE WBC PRESENT,BOTH PMN AND MONONUCLEAR NO ORGANISMS SEEN Performed at [facility], [address], [place], [place] [address]    Culture   Final    FEW STAPHYLOCOCCUS AUREUS FEW STAPHYLOCOCCUS EPIDERMIDIS NO ANAEROBES ISOLATED; CULTURE IN PROGRESS FOR 5 DAYS    Report Status PENDING  Incomplete   Organism ID, Bacteria STAPHYLOCOCCUS AUREUS  Final   Organism ID, Bacteria STAPHYLOCOCCUS EPIDERMIDIS  Final      Susceptibility   Staphylococcus aureus - MIC*    CIPROFLOXACIN <=0.5 SENSITIVE Sensitive     ERYTHROMYCIN 4 INTERMEDIATE Intermediate     GENTAMICIN <=0.5 SENSITIVE Sensitive     OXACILLIN <=0.25 SENSITIVE Sensitive     TETRACYCLINE 8 INTERMEDIATE Intermediate     VANCOMYCIN <=0.5 SENSITIVE Sensitive     TRIMETH/SULFA <=10 SENSITIVE Sensitive     CLINDAMYCIN <=0.25 SENSITIVE Sensitive     RIFAMPIN <=0.5 SENSITIVE Sensitive     Inducible Clindamycin NEGATIVE Sensitive     LINEZOLID 2 SENSITIVE Sensitive     * FEW STAPHYLOCOCCUS AUREUS   Staphylococcus epidermidis - MIC*    CIPROFLOXACIN <=0.5 SENSITIVE Sensitive     ERYTHROMYCIN >=8 RESISTANT Resistant     GENTAMICIN <=0.5 SENSITIVE Sensitive     OXACILLIN <=0.25 SENSITIVE Sensitive  TETRACYCLINE 2 SENSITIVE Sensitive     VANCOMYCIN 1 SENSITIVE Sensitive     TRIMETH/SULFA 160 RESISTANT Resistant     CLINDAMYCIN <=0.25 SENSITIVE Sensitive     RIFAMPIN <=0.5 SENSITIVE Sensitive     Inducible Clindamycin NEGATIVE Sensitive     * FEW STAPHYLOCOCCUS EPIDERMIDIS   Time coordinating discharge: 41 mins   SIGNED:  Standley Dakins, MD  [facility] 04/05/2023, 2:35 PM How to contact the [facility] Attending or Consulting provider 7A - 7P or covering provider during after hours 7P -7A, for this patient?  Check the care team in [facility] and look for a) attending/consulting [facility] provider listed and b) the [facility] team listed Log into [email or web address] and use [employer] universal password  to access. If you do not have the password, please contact the hospital operator. Locate the [facility] provider you are looking for under [facility] and page to a number that you can be directly reached. If you still have difficulty reaching the provider, please page the [facility] (Director on Call) for the Hospitalists listed on amion for assistance.

## 2023-04-05 NOTE — Progress Notes (Signed)
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 - Id f/u on 10/25 with Marcos Eke ot evaluate wound and labs(cbc, cmp, esr and crp).   ID will Sign off

## 2023-04-05 NOTE — Progress Notes (Signed)
Pharmacy Antibiotic Note  Jerome Sutton is a 42 y.o. male admitted on 04/01/2023 with  foot wound .  Pharmacy has been consulted for vancomycin and zosyn dosing.  Patient currently afebrile, wbc wnl. Renal function normal.   Vanc peak done last night was 21. Morning dose was hung prior to trough being drawn. Will retime new levels for today.   10/11 AM: VP 18, VT 7 >> change dose for vancomycin to 1500 mg IV every 12 hours   Plan:  Increase Vancomycin to 1500 mg IV Q 12 hrs. Goal AUC 400-550. Expected AUC: 440 Continue cefepime 2g q8 hours  Height: 5\' 7"  (170.2 cm) Weight: 94.3 kg (208 lb) IBW/kg (Calculated) : 66.1  Temp (24hrs), Avg:98.5 F (36.9 C), Min:98.2 F (36.8 C), Max:98.6 F (37 C)  Recent Labs  Lab 04/01/23 0843 04/01/23 1046 04/02/23 0507 04/04/23 0001 04/04/23 1116 04/04/23 2100  WBC 3.9*  --  4.3  --   --   --   CREATININE 0.92  --  0.86  --   --   --   LATICACIDVEN 2.2* 1.5  --   --   --   --   VANCOTROUGH  --   --   --   --   --  7*  VANCOPEAK  --   --   --  21* 18*  --     Estimated Creatinine Clearance: 125 mL/min (by C-G formula based on SCr of 0.86 mg/dL).    No Known Allergies  Thank you for allowing pharmacy to be a part of this patient's care.  Arabella Merles, PharmD. Clinical Pharmacist 04/05/2023 12:26 AM

## 2023-04-05 NOTE — Telephone Encounter (Addendum)
Error

## 2023-04-05 NOTE — Discharge Instructions (Addendum)
TAKE ZYVOX 10/11 THRU 10/25 (COMPLETE COURSE) THEN START CEFADROXIL 10/25 THRU 11/19 TO COMPLETE FULL COURSE OF TREATMENT   PLEASE FOLLOW UP WITH INFECTIOUS DISEASE CLINIC ON 04/19/23 AS SCHEDULED  PLEASE FOLLOW UP WITH DR HARRISON IN 1 WEEK AFTER DISCHARGE  PLEASE TAKE PROBIOTICS FOR NEXT 2 MONTHS WHILE ON THESE ANTIBIOTICS    IMPORTANT INFORMATION: PAY CLOSE ATTENTION   PHYSICIAN DISCHARGE INSTRUCTIONS  Follow with Primary care provider  Patient, No Pcp Per  and other consultants as instructed by your Hospitalist Physician  SEEK MEDICAL CARE OR RETURN TO EMERGENCY ROOM IF SYMPTOMS COME BACK, WORSEN OR NEW PROBLEM DEVELOPS   Please note: You were cared for by a hospitalist during your hospital stay. Every effort will be made to forward records to your primary care provider.  You can request that your primary care provider send for your hospital records if they have not received them.  Once you are discharged, your primary care physician will handle any further medical issues. Please note that NO REFILLS for any discharge medications will be authorized once you are discharged, as it is imperative that you return to your primary care physician (or establish a relationship with a primary care physician if you do not have one) for your post hospital discharge needs so that they can reassess your need for medications and monitor your lab values.  Please get a complete blood count and chemistry panel checked by your Primary MD at your next visit, and again as instructed by your Primary MD.  Get Medicines reviewed and adjusted: Please take all your medications with you for your next visit with your Primary MD  Laboratory/radiological data: Please request your Primary MD to go over all hospital tests and procedure/radiological results at the follow up, please ask your primary care provider to get all Hospital records sent to his/her office.  In some cases, they will be blood work, cultures and  biopsy results pending at the time of your discharge. Please request that your primary care provider follow up on these results.  If you are diabetic, please bring your blood sugar readings with you to your follow up appointment with primary care.    Please call and make your follow up appointments as soon as possible.    Also Note the following: If you experience worsening of your admission symptoms, develop shortness of breath, life threatening emergency, suicidal or homicidal thoughts you must seek medical attention immediately by calling 911 or calling your MD immediately  if symptoms less severe.  You must read complete instructions/literature along with all the possible adverse reactions/side effects for all the Medicines you take and that have been prescribed to you. Take any new Medicines after you have completely understood and accpet all the possible adverse reactions/side effects.   Do not drive when taking Pain medications or sleeping medications (Benzodiazepines)  Do not take more than prescribed Pain, Sleep and Anxiety Medications. It is not advisable to combine anxiety,sleep and pain medications without talking with your primary care practitioner  Special Instructions: If you have smoked or chewed Tobacco  in the last 2 yrs please stop smoking, stop any regular Alcohol  and or any Recreational drug use.  Wear Seat belts while driving.  Do not drive if taking any narcotic, mind altering or controlled substances or recreational drugs or alcohol.

## 2023-04-05 NOTE — Progress Notes (Signed)
Patient have an uneventful night during this shift. No complaints of pain. Refused scheduled pain medications. Up independently to the bathroom with walker. Plan of care ongoing.

## 2023-04-06 LAB — CULTURE, BLOOD (ROUTINE X 2)
Culture: NO GROWTH
Culture: NO GROWTH
Special Requests: ADEQUATE
Special Requests: ADEQUATE

## 2023-04-10 ENCOUNTER — Encounter (HOSPITAL_COMMUNITY): Payer: Self-pay | Admitting: Orthopedic Surgery

## 2023-04-11 LAB — AEROBIC/ANAEROBIC CULTURE W GRAM STAIN (SURGICAL/DEEP WOUND)

## 2023-04-12 ENCOUNTER — Ambulatory Visit (INDEPENDENT_AMBULATORY_CARE_PROVIDER_SITE_OTHER): Payer: BC Managed Care – PPO | Admitting: Orthopedic Surgery

## 2023-04-12 DIAGNOSIS — L02612 Cutaneous abscess of left foot: Secondary | ICD-10-CM

## 2023-04-12 DIAGNOSIS — M86179 Other acute osteomyelitis, unspecified ankle and foot: Secondary | ICD-10-CM

## 2023-04-12 NOTE — Progress Notes (Signed)
Postop follow-up  April 02, 2023 for incision drainage abscess left foot  I spoke with Dr. Lajoyce Corners regarding this patient and we decided to have me to go ahead and do the incision and drainage and then have him follow-up to discuss possible transmetatarsal amputation  Patient complains of no pain at this time  Dorsal and plantar wounds look clean dry with good granulation tissue.  Current antibiotics include   cefadroxil 500 MG capsule Commonly known as: DURICEF Take 2 capsules (1,000 mg total) by mouth 2 (two) times daily for 50 doses. Start taking on: April 19, 2023    linezolid 600 MG tablet Commonly known as: ZYVOX Take 1 tablet (600 mg total) by mouth every 12 (twelve) hours for 28 doses.     Dressing was changed today to saline soaked dressings were placed and the foot was wrapped  Follow-up here in a week if he does not see Dr. Lajoyce Corners next week otherwise he will see Dr. Lajoyce Corners and depending on what he says will depend on follow-up going forward   Culture FEW STAPHYLOCOCCUS AUREUS FEW STAPHYLOCOCCUS EPIDERMIDIS NO ANAEROBES ISOLATED Performed at [facility], [address], [place], [place] [address]  Report Status 04/11/2023 FINAL  Organism ID, Bacteria STAPHYLOCOCCUS AUREUS  Organism ID, Bacteria STAPHYLOCOCCUS EPIDERMIDIS  Resulting Agency [facility]     Susceptibility   Staphylococcus aureus Staphylococcus epidermidis    MIC MIC    CIPROFLOXACIN <=0.5 SENSI... Sensitive <=0.5 SENSI... Sensitive    CLINDAMYCIN <=0.25 SENS... Sensitive <=0.25 SENS... Sensitive    ERYTHROMYCIN 4 INTERMEDI... Intermediate >=8 RESISTANT Resistant    GENTAMICIN <=0.5 SENSI... Sensitive <=0.5 SENSI... Sensitive    Inducible Clindamycin NEGATIVE Sensitive NEGATIVE Sensitive    LINEZOLID 2 SENSITIVE Sensitive      OXACILLIN <=0.25 SENS... Sensitive <=0.25 SENS... Sensitive    RIFAMPIN <=0.5 SENSI... Sensitive <=0.5 SENSI... Sensitive    TETRACYCLINE 8 INTERMEDI... Intermediate 2  SENSITIVE Sensitive    TRIMETH/SULFA <=10 SENSIT... Sensitive 160 RESISTANT Resistant    VANCOMYCIN <=0.5 SENSI... Sensitive 1 SENSITIVE Sensitive

## 2023-04-19 ENCOUNTER — Ambulatory Visit (INDEPENDENT_AMBULATORY_CARE_PROVIDER_SITE_OTHER): Payer: BC Managed Care – PPO | Admitting: Infectious Diseases

## 2023-04-19 ENCOUNTER — Encounter: Payer: Self-pay | Admitting: Infectious Diseases

## 2023-04-19 ENCOUNTER — Other Ambulatory Visit: Payer: Self-pay

## 2023-04-19 VITALS — BP 153/99 | HR 114 | Temp 98.6°F | Ht 66.0 in | Wt 211.0 lb

## 2023-04-19 DIAGNOSIS — M86272 Subacute osteomyelitis, left ankle and foot: Secondary | ICD-10-CM | POA: Diagnosis not present

## 2023-04-19 DIAGNOSIS — M00072 Staphylococcal arthritis, left ankle and foot: Secondary | ICD-10-CM

## 2023-04-19 DIAGNOSIS — Z79899 Other long term (current) drug therapy: Secondary | ICD-10-CM | POA: Insufficient documentation

## 2023-04-19 NOTE — Progress Notes (Addendum)
Patient Active Problem List   Diagnosis Date Noted   Acute osteomyelitis of foot (HCC) 04/01/2023   Septic arthritis of left foot (HCC) 04/01/2023   Osteomyelitis of left foot (HCC) 07/04/2017   Abscess of foot left s/p I and D 07/04/17 07/03/2017    Patient's Medications  New Prescriptions   No medications on file  Previous Medications   ACETAMINOPHEN (TYLENOL) 325 MG TABLET    Take 2 tablets (650 mg total) by mouth every 6 (six) hours as needed for mild pain (or Fever >/= 101).   CEFADROXIL (DURICEF) 500 MG CAPSULE    Take 2 capsules (1,000 mg total) by mouth 2 (two) times daily for 50 doses.   LINEZOLID (ZYVOX) 600 MG TABLET    Take 1 tablet (600 mg total) by mouth every 12 (twelve) hours for 28 doses.  Modified Medications   No medications on file  Discontinued Medications   No medications on file    Subjective: 42 Y O male with h/o back surgery about 10 years ago leading to numbness in left foot and left foot septic arthritis requiring I and D, bone resection, MP joint resection of 4th digit in 2019 with cx growing Morganella morganii, providencia rettgerii and mixed anaerobes treated with ciprofloxacin * 6 weeks who is here for follow-up for acute steomyelitis and septic arthritis of left foot. MRI left foot with findings as below. He underwent I and D of left foot 10/8. OR findings: Grayish fluid in the soft tissues on the dorsal and plantar aspect of the lateral side of the foot. No gross abscess but obvious inflammatory tissue necrotic tissue indicating abscess inflammation or phlegmon. OR cx with MSSE, MSSA, patient was discharged 10/11 to complete 2 weeks of PO linezolid , EOT 10/25 followed by cefadroxil to complete 6 weeks course, EOT 05/13/23  10/25 He completed a course of linezolid without missing any doses and started cefadroxil today. He experienced transient diarrhea while on linezolid, which resolved on its own. He denies any systemic symptoms like fever, chills,  nausea, or vomiting. He has been following up with an orthopedic surgeon, Dr. Romeo Apple, who has assessed the wound last Friday and was looking good. He is scheduled to see him again next week. Denies h/o DM, Denies smoking, alcohol and IVDU. No new complaints.   Review of Systems: as above  No past medical history on file.  Social History   Tobacco Use   Smoking status: Never   Smokeless tobacco: Never  Vaping Use   Vaping status: Never Used  Substance Use Topics   Alcohol use: No   Drug use: No    No family history on file.  No Known Allergies  Health Maintenance  Topic Date Due   HIV Screening  Never done   Hepatitis C Screening  Never done   DTaP/Tdap/Td (1 - Tdap) Never done   COVID-19 Vaccine (1 - 2023-24 season) Never done   INFLUENZA VACCINE  Completed   HPV VACCINES  Aged Out   Objective:  Vitals:   04/19/23 0911  BP: (!) 153/99  Pulse: (!) 114  Temp: 98.6 F (37 C)  TempSrc: Temporal  SpO2: 99%  Weight: 211 lb (95.7 kg)  Height: 5\' 6"  (1.676 m)   Body mass index is 34.06 kg/m.  Physical Exam Constitutional:      Appearance: Normal appearance.  HENT:     Head: Normocephalic and atraumatic.      Mouth: Mucous membranes are moist.  Eyes:    Conjunctiva/sclera: Conjunctivae normal.     Pupils: Pupils are equal, round, and bilaterally symmetrical   Cardiovascular:     Rate and Rhythm: Normal rate and regular rhythm.     Heart sounds: s1s2  Pulmonary:     Effort: Pulmonary effort is normal.     Breath sounds: Normal breath sounds   Abdominal:     General: Non distended     Palpations: soft.   Musculoskeletal:        General: ambulatory  Left lateral  dorsal foot wound with pink granulation tissue, healing, no signs of infection. DP palpable  Left lateral plantar foot wound with pink granulation tissue, no signs of infection  ( See media)  Skin:    General: Skin is warm and dry.     Comments:  Neurological:     General: grossly non  focal     Mental Status: awake, alert and oriented to person, place, and time.   Psychiatric:        Mood and Affect: Mood normal.   Lab Results Lab Results  Component Value Date   WBC 4.3 04/02/2023   HGB 12.3 (L) 04/02/2023   HCT 40.1 04/02/2023   MCV 90.9 04/02/2023   PLT 305 04/02/2023    Lab Results  Component Value Date   CREATININE 0.86 04/02/2023   BUN 9 04/02/2023   NA 137 04/02/2023   K 3.6 04/02/2023   CL 102 04/02/2023   CO2 26 04/02/2023    Lab Results  Component Value Date   ALT 43 04/01/2023   AST 31 04/01/2023   ALKPHOS 52 04/01/2023   BILITOT 0.4 04/01/2023    No results found for: "CHOL", "HDL", "LDLCALC", "LDLDIRECT", "TRIG", "CHOLHDL" No results found for: "LABRPR", "RPRTITER" No results found for: "HIV1RNAQUANT", "HIV1RNAVL", "CD4TABS"  Microbiology Results for orders placed or performed during the hospital encounter of 04/01/23  Blood culture (routine x 2)     Status: None   Collection Time: 04/01/23  8:12 AM   Specimen: BLOOD  Result Value Ref Range Status   Specimen Description BLOOD LEFT ARM  Final   Special Requests   Final    BOTTLES DRAWN AEROBIC AND ANAEROBIC Blood Culture adequate volume   Culture   Final    NO GROWTH 5 DAYS Performed at [facility], [address], [place], [place] [address]    Report Status 04/06/2023 FINAL  Final  Blood culture (routine x 2)     Status: None   Collection Time: 04/01/23 12:12 PM   Specimen: BLOOD  Result Value Ref Range Status   Specimen Description BLOOD BLOOD LEFT ARM  Final   Special Requests   Final    BOTTLES DRAWN AEROBIC AND ANAEROBIC Blood Culture adequate volume   Culture   Final    NO GROWTH 5 DAYS Performed at [facility], [address] [place], [place] [address]    Report Status 04/06/2023 FINAL  Final  Surgical PCR screen     Status: Abnormal   Collection Time: 04/01/23  4:14 PM   Specimen: Nasal Mucosa; Nasal Swab  Result Value Ref Range Status   MRSA, PCR NEGATIVE  NEGATIVE Final   Staphylococcus aureus POSITIVE (A) NEGATIVE Final    Comment: (NOTE) The Xpert SA Assay (FDA approved for NASAL specimens in patients 25 years of age and older), is one component of a comprehensive surveillance program. It is not intended to diagnose infection nor to guide or monitor treatment. Performed at  [facility], [address] [place], [place] [address]   Aerobic/Anaerobic Culture w Gram Stain (surgical/deep wound)     Status: None   Collection Time: 04/02/23 11:05 AM   Specimen: Path fluid; Body Fluid  Result Value Ref Range Status   Specimen Description   Final    FLUID Performed at [facility], [address] [place], [place] [address]    Special Requests   Final    NONE Performed at [facility], [address], [place], [place] [address]    Gram Stain   Final    RARE WBC PRESENT,BOTH PMN AND MONONUCLEAR NO ORGANISMS SEEN    Culture   Final    FEW STAPHYLOCOCCUS AUREUS FEW STAPHYLOCOCCUS EPIDERMIDIS NO ANAEROBES ISOLATED Performed at [facility], [address], [place], [place] [address]    Report Status 04/11/2023 FINAL  Final   Organism ID, Bacteria STAPHYLOCOCCUS AUREUS  Final   Organism ID, Bacteria STAPHYLOCOCCUS EPIDERMIDIS  Final      Susceptibility   Staphylococcus aureus - MIC*    CIPROFLOXACIN <=0.5 SENSITIVE Sensitive     ERYTHROMYCIN 4 INTERMEDIATE Intermediate     GENTAMICIN <=0.5 SENSITIVE Sensitive     OXACILLIN <=0.25 SENSITIVE Sensitive     TETRACYCLINE 8 INTERMEDIATE Intermediate     VANCOMYCIN <=0.5 SENSITIVE Sensitive     TRIMETH/SULFA <=10 SENSITIVE Sensitive     CLINDAMYCIN <=0.25 SENSITIVE Sensitive     RIFAMPIN <=0.5 SENSITIVE Sensitive     Inducible Clindamycin NEGATIVE Sensitive     LINEZOLID 2 SENSITIVE Sensitive     * FEW STAPHYLOCOCCUS AUREUS   Staphylococcus epidermidis - MIC*    CIPROFLOXACIN <=0.5 SENSITIVE Sensitive     ERYTHROMYCIN >=8 RESISTANT Resistant     GENTAMICIN <=0.5 SENSITIVE Sensitive      OXACILLIN <=0.25 SENSITIVE Sensitive     TETRACYCLINE 2 SENSITIVE Sensitive     VANCOMYCIN 1 SENSITIVE Sensitive     TRIMETH/SULFA 160 RESISTANT Resistant     CLINDAMYCIN <=0.25 SENSITIVE Sensitive     RIFAMPIN <=0.5 SENSITIVE Sensitive     Inducible Clindamycin NEGATIVE Sensitive     * FEW STAPHYLOCOCCUS EPIDERMIDIS   Imaging MR FOOT LEFT W WO CONTRAST  Result Date: 04/01/2023 CLINICAL DATA:  Soft tissue infection suspected.  Osteomyelitis. EXAM: MRI OF THE LEFT FOREFOOT WITHOUT AND WITH CONTRAST TECHNIQUE: Multiplanar, multisequence MR imaging of the left forefoot was performed both before and after administration of intravenous contrast. CONTRAST:  10mL GADAVIST GADOBUTROL 1 MMOL/ML IV SOLN COMPARISON:  Radiographs 04/01/2023 and 07/03/2017. CT 04/01/2023 and MRI 07/04/2017. FINDINGS: Bones/Joint/Cartilage As shown on earlier radiographs and CT, there is septic arthritis at the 5th metatarsophalangeal joint with acute osteomyelitis involving the 5th metatarsal and base of the 5th proximal phalanx. Marrow T2 hyperintensity and enhancement extends into the base of the 5th metatarsal. 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 No evidence of osteomyelitis within the 1st through 3rd rays or visualized bones of the midfoot. Chronic deformities of the 2nd and 3rd toes. Evaluation of the proximal phalanx of the great toe is limited by artifact from the linear metallic foreign body seen on the earlier radiographs. Ligaments Intact Lisfranc ligament. The collateral ligaments of the remaining metatarsophalangeal joints are intact. Muscles and Tendons Generalized forefoot muscular atrophy and edema. No significant tenosynovitis identified. Soft tissues Soft tissue ulceration along the plantar aspect of the 5th metatarsophalangeal joint with  underlying ill-defined, peripherally enhancing fluid collection surrounding the destroyed joint. Apparent additional area of soft tissue ulceration dorsally with a peripherally enhancing fluid collection dorsal to the 4th metatarsal remnant, measuring up to 1.9 cm on image 20/13. There is a 3rd peripherally enhancing fluid collection along the lateral and plantar aspect of the 5th metatarsal base which measures up to 1.3 cm on image 8/13. No obvious soft tissue ulceration identified in this region. IMPRESSION: 1. Septic arthritis at the 5th metatarsophalangeal joint with acute osteomyelitis involving the 5th metatarsal and base of the 5th proximal phalanx. 2. Soft tissue ulceration along the plantar aspect of the 5th metatarsophalangeal joint with underlying ill-defined, peripherally enhancing fluid collection surrounding the destroyed joint, consistent with an abscess. 3. Additional soft tissue ulceration dorsally with peripherally enhancing fluid collection dorsal to the 4th metatarsal remnant, measuring up to 1.9 cm. This could reflect a postsurgical fluid collection, although suspicious for an additional abscess. 4. 3rd peripherally enhancing fluid collection along the lateral and plantar aspect of the 5th metatarsal base measuring up to 1.3 cm. 5. 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 6. Chronic deformities of the 2nd and 3rd toes. Evaluation of the proximal phalanx of the great toe is limited by artifact from the linear metallic foreign body seen on the earlier radiographs. Electronically Signed   By: Carey Bullocks M.D.   On: 04/01/2023 13:48   DG Foot Complete Left  Result Date: 04/01/2023 CLINICAL DATA:  [address] Abscess [id number] EXAM: LEFT FOOT - COMPLETE 3+ VIEW COMPARISON:  Same-day CT of the left foot at 8:43 a.m. FINDINGS: Plantar  soft tissue wound of the forefoot at the level of fifth MTP articulation. Additional region of subcutaneous edema noted at the lateral midfoot, overlying the base of fifth metatarsal. There is essentially complete destruction of fifth MTP joint with osteolysis involving the remnant fifth distal metatarsal shaft and base of fifth proximal phalanx. Redemonstration of sequela of prior septic arthritis of fourth MTP joint with chronic appearing bony destruction and possible postsurgical changes of the distal fourth metatarsal and base of fourth proximal phalanx. Severe dorsiflexion is again noted at the second and third MTP joints. There is a 9 mm linear radiopaque foreign body projecting in the lateral soft tissues of the first proximal phalanx. IMPRESSION: 1. Septic arthritis of fifth MTP joint with findings concerning for osteomyelitis of the fifth distal metatarsal shaft and base of the fifth proximal phalanx with an overlying plantar soft tissue wound. 2. Sequela of prior septic arthritis of the fourth MTP joint. 3. 9 mm linear radiopaque foreign body in the lateral soft tissues of the first proximal phalanx. Electronically Signed   By: Hart Robinsons M.D.   On: 04/01/2023 13:09   CT Foot Left Wo Contrast  Result Date: 04/01/2023 CLINICAL DATA:  Osteomyelitis suspected, foot, xray done h/ osteomyelitis, r/o nec fasc. EXAM: CT OF THE LEFT FOOT WITHOUT CONTRAST TECHNIQUE: Multidetector CT imaging of the left foot was performed according to the standard protocol. Multiplanar CT image reconstructions were also generated. RADIATION DOSE REDUCTION: This exam was performed according to the departmental dose-optimization program which includes automated exposure control, adjustment of the mA and/or kV according to patient size and/or use of iterative reconstruction technique. COMPARISON:  MRI left foot dated July 04, 2017. FINDINGS: Bones/Joint/Cartilage There has been complete destruction of the fifth MTP joint  with regions of cortical indistinctness and  osteolysis involving the remnant fifth distal metatarsal metaphyseal shaft and base of the fifth proximal phalanx. Punctate curvilinear foci of mineralization within the region of the fifth MTP articulation. The remnant base of fifth proximal phalanx is subluxed dorsally relative to the remnant fifth distal metatarsal shaft. Redemonstration of sequela of prior septic arthritis of the fourth MTP joint with associated chronic appearing bony destruction and possible postsurgical changes of the distal fourth metatarsal and base of fourth proximal phalanx. No definite evidence of new osteolysis or periostitis involving the remnant fourth metatarsal shaft and fourth proximal phalanx. There is pronounced dorsiflexion noted at the second and third MTP joints. No additional areas of discernible osteolysis or periostitis identified. Punctate ossicles at the base of the proximal cuboid, may relate to prior trauma, however, the more dominant sized ossicle may represent an os peroneum. The visualized hindfoot and midfoot articulations are intact. Ligaments Suboptimally assessed by CT. Soft tissues There is a plantar soft tissue wound of the forefoot at the level of the fifth MTP joint which tracks deep to communicate with a peripherally enhancing fluid collection, with extension into the region of the bony destructive changes of the fifth MTP joint, as described above. This collection measures 2.6 cm transverse x 2.7 cm AP x 1.6 cm craniocaudal (series 7, image 96 and series 9, images 123 and 124). There is surrounding edema and infiltration of the subcutaneous fat with overlying cutaneous thickening. No soft tissue gas. Cutaneous thickening is noted involving the soft tissues of the lateral midfoot, overlying the base of fifth metatarsal. There is underlying subcutaneous edema and ill-defined fluid with a loculated irregular peripherally enhancing fluid collection measuring 1.1 cm  transverse x 0.6 cm AP x 0.6 cm craniocaudal (series 7, image 155 and series 9, image 126). There is a focal region of cutaneous thickening overlying the head of third metatarsal with mild surrounding edema. No discrete fluid collection is identified in this region. There is dorsal and lateral subcutaneous edema of the midfoot and forefoot. IMPRESSION: 1. Septic arthritis of the fifth MTP joint with osteolysis of the fifth distal metatarsal shaft and base of fifth proximal phalanx, concerning for osteomyelitis. There is an underlying abscess, with measurements as above, which communicates with a plantar soft tissue wound overlying the fifth MTP joint and surrounding cellulitis. 2. Additional rim enhancing fluid collection, favored to represent an abscess, involving the soft tissues of the lateral midfoot, overlying the base of fifth metatarsal. Cellulitis of the dorsal and lateral midfoot through forefoot. 3. Focal cutaneous thickening overlying the head of the third metatarsal with mild surrounding edema could represent cellulitis. 4. Sequela of prior septic arthritis of the fourth MTP joint with associated chronic appearing bony destruction and possible postoperative changes of the distal fourth metatarsal and base of the fourth proximal phalanx without definite evidence of new osteolysis or periostitis. Electronically Signed   By: Hart Robinsons M.D.   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 OR cx with MSSE, MSSA  Plan  - Continue PO cefadroxil as planned to complete 6 weeks course. EOT 05/13/23 - Labs today  - Fu with Ortho next Monday - Fu with [place] in 3 weeks prior to EOT   I have personally spent 55  minutes involved in face-to-face and non-face-to-face activities for this patient on the day of the visit. Professional time spent includes the following activities: Preparing to see the patient (review of tests), Obtaining and/or reviewing separately obtained history (admission/discharge record), Performing a medically appropriate examination and/or evaluation , Ordering medications/tests/procedures, referring and communicating with other health care professionals, Documenting clinical information in the EMR, Independently interpreting results (not separately reported), Communicating results to the patient/family/caregiver, Counseling and educating the patient/family/caregiver and Care coordination (not separately reported).   Victoriano Lain, MD [facility] for Infectious Disease [facility] 04/19/2023, 9:36 AM

## 2023-04-20 LAB — BASIC METABOLIC PANEL
BUN/Creatinine Ratio: 29 (calc) — ABNORMAL HIGH (ref 6–22)
BUN: 30 mg/dL — ABNORMAL HIGH (ref 7–25)
CO2: 25 mmol/L (ref 20–32)
Calcium: 9.3 mg/dL (ref 8.6–10.3)
Chloride: 101 mmol/L (ref 98–110)
Creat: 1.03 mg/dL (ref 0.60–1.29)
Glucose, Bld: 117 mg/dL — ABNORMAL HIGH (ref 65–99)
Potassium: 4.1 mmol/L (ref 3.5–5.3)
Sodium: 137 mmol/L (ref 135–146)

## 2023-04-20 LAB — CBC
HCT: 42 % (ref 38.5–50.0)
Hemoglobin: 13.1 g/dL — ABNORMAL LOW (ref 13.2–17.1)
MCH: 27.9 pg (ref 27.0–33.0)
MCHC: 31.2 g/dL — ABNORMAL LOW (ref 32.0–36.0)
MCV: 89.6 fL (ref 80.0–100.0)
MPV: 9.7 fL (ref 7.5–12.5)
Platelets: 188 10*3/uL (ref 140–400)
RBC: 4.69 10*6/uL (ref 4.20–5.80)
RDW: 12.5 % (ref 11.0–15.0)
WBC: 6.6 10*3/uL (ref 3.8–10.8)

## 2023-04-20 LAB — C-REACTIVE PROTEIN: CRP: <3 mg/L (ref <8.0)

## 2023-04-20 LAB — SEDIMENTATION RATE: Sed Rate: 22 mm/h — ABNORMAL HIGH (ref 0–15)

## 2023-04-22 ENCOUNTER — Telehealth: Payer: Self-pay

## 2023-04-22 ENCOUNTER — Ambulatory Visit (INDEPENDENT_AMBULATORY_CARE_PROVIDER_SITE_OTHER): Payer: BC Managed Care – PPO | Admitting: Orthopedic Surgery

## 2023-04-22 DIAGNOSIS — M86179 Other acute osteomyelitis, unspecified ankle and foot: Secondary | ICD-10-CM

## 2023-04-22 DIAGNOSIS — L02612 Cutaneous abscess of left foot: Secondary | ICD-10-CM

## 2023-04-22 NOTE — Progress Notes (Signed)
Please let him know lab work is without any acute abnormality. ESR downtrending and CRP normal which is a good sign in terms of infection.

## 2023-04-22 NOTE — Patient Instructions (Signed)
We are referring you to [facility] from [facility] address is [place] The phone number is [phone]  The office will call you with an appointment Dr. Sharol Given

## 2023-04-22 NOTE — Telephone Encounter (Signed)
-----   Message from Victoriano Lain sent at 04/22/2023  6:49 AM EDT ----- Please let him know lab work is without any acute abnormality. ESR downtrending and CRP normal which is a good sign in terms of infection.

## 2023-04-22 NOTE — Progress Notes (Signed)
Postop appointment status post incision and drainage abscess on April 02, 2023  Patient is on cefadroxil p.o. based on culture results.  The patient is doing well in terms of his wound the wound is closing up noted signs of recurrence but we would like to have him see Dr. Lajoyce Corners to discuss possible definitive care for his foot deformity and recurrent infection  Culture FEW STAPHYLOCOCCUS AUREUS FEW STAPHYLOCOCCUS EPIDERMIDIS NO ANAEROBES ISOLATED Performed at [facility], [address], [place], [place] [address]  Report Status 04/11/2023 FINAL  Organism ID, Bacteria STAPHYLOCOCCUS AUREUS  Organism ID, Bacteria STAPHYLOCOCCUS EPIDERMIDIS  Resulting Agency [facility]      Susceptibility             Staphylococcus aureus Staphylococcus epidermidis      MIC MIC      CIPROFLOXACIN <=0.5 SENSI... Sensitive <=0.5 SENSI... Sensitive      CLINDAMYCIN <=0.25 SENS... Sensitive <=0.25 SENS... Sensitive      ERYTHROMYCIN 4 INTERMEDI... Intermediate >=8 RESISTANT Resistant      GENTAMICIN <=0.5 SENSI... Sensitive <=0.5 SENSI... Sensitive      Inducible Clindamycin NEGATIVE Sensitive NEGATIVE Sensitive      LINEZOLID 2 SENSITIVE Sensitive          OXACILLIN <=0.25 SENS... Sensitive <=0.25 SENS... Sensitive      RIFAMPIN <=0.5 SENSI... Sensitive <=0.5 SENSI... Sensitive      TETRACYCLINE 8 INTERMEDI... Intermediate 2 SENSITIVE Sensitive      TRIMETH/SULFA <=10 SENSIT... Sensitive 160 RESISTANT Resistant      VANCOMYCIN <=0.5 SENSI... Sensitive 1 SENSITIVE Sensitive

## 2023-04-24 NOTE — Telephone Encounter (Signed)
Called patient to relay lab results, no answer. Left HIPAA compliant voicemail requesting callback.   Sandie Ano, RN

## 2023-05-02 ENCOUNTER — Other Ambulatory Visit (INDEPENDENT_AMBULATORY_CARE_PROVIDER_SITE_OTHER): Payer: BC Managed Care – PPO

## 2023-05-02 ENCOUNTER — Ambulatory Visit: Payer: BC Managed Care – PPO | Admitting: Orthopedic Surgery

## 2023-05-02 ENCOUNTER — Other Ambulatory Visit: Payer: Self-pay

## 2023-05-02 DIAGNOSIS — L02612 Cutaneous abscess of left foot: Secondary | ICD-10-CM

## 2023-05-02 DIAGNOSIS — M86179 Other acute osteomyelitis, unspecified ankle and foot: Secondary | ICD-10-CM | POA: Diagnosis not present

## 2023-05-06 ENCOUNTER — Encounter: Payer: Self-pay | Admitting: Orthopedic Surgery

## 2023-05-06 NOTE — Progress Notes (Signed)
Office Visit Note   Patient: Jerome Sutton           Date of Birth: 02/18/1981           MRN: [id number] Visit Date: 05/02/2023              Requested by: Vickki Hearing, MD [address] [place],  [place] [address] PCP: Patient, No Pcp Per  Chief Complaint  Patient presents with   Left Foot - Abscess      HPI: Patient is a 42 year old gentleman who has undergone 2 previous surgeries of the left foot by Dr. Romeo Apple for a left foot infection.  Patient states that occasionally he has drainage denies any odor or swelling.  Denies a history of diabetes.  He is not a smoker.  Assessment & Plan: Visit Diagnoses:  1. Acute osteomyelitis of foot (HCC)   2. Abscess of left foot     Plan: Will obtain an MRI scan of the left foot.  Anticipate patient will need surgical intervention possibly transmetatarsal amputation.  Follow-Up Instructions: Return in about 2 weeks (around 05/16/2023).   Ortho Exam  Patient is alert, oriented, no adenopathy, well-dressed, normal affect, normal respiratory effort. Examination patient has a strong dorsalis pedis pulse there is no cellulitis.  Radiographs show destructive bony changes of the fourth and fifth metatarsal head.  We will place patient in a postoperative shoe.  Hemoglobin A1c normal at 4.9.  Sed rate elevated at 22.  Most recent C-reactive protein is normal.  Recent cultures are positive for Staph aureus.  Imaging: No results found. No images are attached to the encounter.  Labs: Lab Results  Component Value Date   HGBA1C 4.9 04/02/2023   ESRSEDRATE 22 (H) 04/19/2023   ESRSEDRATE 48 (H) 04/02/2023   ESRSEDRATE 44 (H) 07/03/2017   CRP <3.0 04/19/2023   CRP 0.7 04/02/2023   CRP 10.3 (H) 07/03/2017   LABURIC 5.0 07/03/2017   REPTSTATUS 04/11/2023 FINAL 04/02/2023   GRAMSTAIN  04/02/2023    RARE WBC PRESENT,BOTH PMN AND MONONUCLEAR NO ORGANISMS SEEN    CULT  04/02/2023    FEW STAPHYLOCOCCUS AUREUS FEW STAPHYLOCOCCUS  EPIDERMIDIS NO ANAEROBES ISOLATED Performed at [facility], [address], [place], [place] [address]    [facility] STAPHYLOCOCCUS AUREUS 04/02/2023   LABORGA STAPHYLOCOCCUS EPIDERMIDIS 04/02/2023     Lab Results  Component Value Date   ALBUMIN 3.2 (L) 04/01/2023    No results found for: "MG" No results found for: "VD25OH"  No results found for: "PREALBUMIN"    Latest Ref Rng & Units 04/19/2023    9:44 AM 04/02/2023    5:07 AM 04/01/2023    8:43 AM  CBC EXTENDED  WBC 3.8 - 10.8 Thousand/uL 6.6  4.3  3.9   RBC 4.20 - 5.80 Million/uL 4.69  4.41  4.65   Hemoglobin 13.2 - 17.1 g/dL 78.4  69.6  29.5   HCT 38.5 - 50.0 % 42.0  40.1  42.6   Platelets 140 - 400 Thousand/uL 188  305  301   NEUT# 1.7 - 7.7 K/uL   2.8   Lymph# 0.7 - 4.0 K/uL   0.5      There is no height or weight on file to calculate BMI.  Orders:  Orders Placed This Encounter  Procedures   XR Foot Complete Left   MR Foot Left w/o contrast   No orders of the defined types were placed in this encounter.    Procedures:  No procedures performed  Clinical Data: No additional findings.  ROS:  All other systems negative, except as noted in the HPI. Review of Systems  Objective: Vital Signs: There were no vitals taken for this visit.  Specialty Comments:  No specialty comments available.  PMFS History: Patient Active Problem List   Diagnosis Date Noted   Medication management 04/19/2023   Acute osteomyelitis of foot (HCC) 04/01/2023   Septic arthritis of left foot (HCC) 04/01/2023   Osteomyelitis of left foot (HCC) 07/04/2017   Abscess of foot left s/p I and D 07/04/17 07/03/2017   History reviewed. No pertinent past medical history.  History reviewed. No pertinent family history.  Past Surgical History:  Procedure Laterality Date   BACK SURGERY     INCISION AND DRAINAGE ABSCESS Left 07/04/2017   Procedure: INCISION AND DRAINAGE ABSCESS LEFT FOOT AND FOURTH METATARSOPHALANGEAL BONE RESECTION  AND MANUAL MANIPULATION OF SECOND AND THIRD TOES;  Surgeon: Vickki Hearing, MD;  Location: [facility];  Service: Orthopedics;  Laterality: Left;   INCISION AND DRAINAGE ABSCESS Left 04/02/2023   Procedure: INCISION AND DRAINAGE ABSCESS;  Surgeon: Vickki Hearing, MD;  Location: [facility];  Service: Orthopedics;  Laterality: Left;  left foot   LUMBAR LAMINECTOMY/DECOMPRESSION MICRODISCECTOMY  02/18/2012   Procedure: LUMBAR LAMINECTOMY/DECOMPRESSION MICRODISCECTOMY 1 LEVEL;  Surgeon: Mariam Dollar, MD;  Location: [facility];  Service: Neurosurgery;  Laterality: Bilateral;  Bilateral Lumbar Laminectomy and diskectomy   Social History   Occupational History   Not on file  Tobacco Use   Smoking status: Never   Smokeless tobacco: Never  Vaping Use   Vaping status: Never Used  Substance and Sexual Activity   Alcohol use: No   Drug use: No   Sexual activity: Not on file

## 2023-05-07 ENCOUNTER — Encounter: Payer: Self-pay | Admitting: Orthopedic Surgery

## 2023-05-09 ENCOUNTER — Other Ambulatory Visit: Payer: Self-pay

## 2023-05-09 ENCOUNTER — Ambulatory Visit (INDEPENDENT_AMBULATORY_CARE_PROVIDER_SITE_OTHER): Payer: BC Managed Care – PPO | Admitting: Internal Medicine

## 2023-05-09 ENCOUNTER — Encounter: Payer: Self-pay | Admitting: Internal Medicine

## 2023-05-09 VITALS — BP 147/99 | HR 114 | Temp 98.3°F | Ht 67.0 in | Wt 213.0 lb

## 2023-05-09 DIAGNOSIS — M00072 Staphylococcal arthritis, left ankle and foot: Secondary | ICD-10-CM | POA: Diagnosis not present

## 2023-05-09 MED ORDER — CEFADROXIL 500 MG PO CAPS: 1000.0000 mg | ORAL_CAPSULE | Freq: Two times a day (BID) | ORAL | 0 refills | Status: AC

## 2023-05-09 NOTE — Patient Instructions (Signed)
Continue cefadroxil till ID appt on 12/3

## 2023-05-09 NOTE — Progress Notes (Signed)
Patient: Jerome Sutton  DOB: Oct 17, 1980 MRN: [id number] PCP: Patient, No Pcp Per    Chief Complaint  Patient presents with   Follow-up     Patient Active Problem List   Diagnosis Date Noted   Medication management 04/19/2023   Acute osteomyelitis of foot (HCC) 04/01/2023   Septic arthritis of left foot (HCC) 04/01/2023   Osteomyelitis of left foot (HCC) 07/04/2017   Abscess of foot left s/p I and D 07/04/17 07/03/2017     Subjective:  Jerome Sutton is a 42 y.o. male with history of back surgery about 10 years ago leading to numbness in left foot left foot septic arthritis requiring I&D, bone resection, NP joint resection for digit in 2019 cultures growing Morganella morganii, Providencia rettgeri and mixed anaerobes treated with Cipro x 6 weeks followed by Dr. Isaiah Serge Dahari lasting on 10/25.  Patient underwent I&D of his left foot 10/8 with cultures growing MSSE, MSSA discharge on 10/11 to complete 2 weeks of p.o. linezolid EOT 10/25 followed by cefadroxil to complete a total 6-week 05/13/2023.  On 10/25 ESR trended down from 48-22 and CRP was normal.  He was seen by orthopedics 11/7 with possible TMA pending MRI. Today 05/09/2023: PT states he is adherent to abx. Seen by ortho. Denies fevers and chills.   Review of Systems  All other systems reviewed and are negative.   No past medical history on file.  Outpatient Medications Prior to Visit  Medication Sig Dispense Refill   acetaminophen (TYLENOL) 325 MG tablet Take 2 tablets (650 mg total) by mouth every 6 (six) hours as needed for mild pain (or Fever >/= 101).     cefadroxil (DURICEF) 500 MG capsule Take 2 capsules (1,000 mg total) by mouth 2 (two) times daily for 50 doses. 100 capsule 0   No facility-administered medications prior to visit.     No Known Allergies  Social History   Tobacco Use   Smoking status: Never   Smokeless tobacco: Never  Vaping Use   Vaping status: Never Used  Substance Use Topics    Alcohol use: No   Drug use: No    No family history on file.  Objective:   Vitals:   05/09/23 0908  Weight: 213 lb (96.6 kg)  Height: 5\' 7"  (1.702 m)   Body mass index is 33.36 kg/m.  Physical Exam Constitutional:      General: He is not in acute distress.    Appearance: He is normal weight. He is not toxic-appearing.  HENT:     Head: Normocephalic and atraumatic.     Right Ear: External ear normal.     Left Ear: External ear normal.     Nose: No congestion or rhinorrhea.     Mouth/Throat:     Mouth: Mucous membranes are moist.     Pharynx: Oropharynx is clear.  Eyes:     Extraocular Movements: Extraocular movements intact.     Conjunctiva/sclera: Conjunctivae normal.     Pupils: Pupils are equal, round, and reactive to light.  Cardiovascular:     Rate and Rhythm: Normal rate and regular rhythm.     Heart sounds: No murmur heard.    No friction rub. No gallop.  Pulmonary:     Effort: Pulmonary effort is normal.     Breath sounds: Normal breath sounds.  Abdominal:     General: Abdomen is flat. Bowel sounds are normal.     Palpations: Abdomen is soft.  Musculoskeletal:  General: No swelling.     Cervical back: Normal range of motion and neck supple.  Skin:    General: Skin is warm and dry.  Neurological:     General: No focal deficit present.     Mental Status: He is oriented to person, place, and time.  Psychiatric:        Mood and Affect: Mood normal.     Lab Results: Lab Results  Component Value Date   WBC 6.6 04/19/2023   HGB 13.1 (L) 04/19/2023   HCT 42.0 04/19/2023   MCV 89.6 04/19/2023   PLT 188 04/19/2023    Lab Results  Component Value Date   CREATININE 1.03 04/19/2023   BUN 30 (H) 04/19/2023   NA 137 04/19/2023   K 4.1 04/19/2023   CL 101 04/19/2023   CO2 25 04/19/2023    Lab Results  Component Value Date   ALT 43 04/01/2023   AST 31 04/01/2023   ALKPHOS 52 04/01/2023   BILITOT 0.4 04/01/2023     Assessment & Plan:  #  Septic arthritis as well as Osteomyelitis of Left 5th MP joint includind 5th metatarsal  and 5th proximal phalanx #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 EOT  was 05/13/23 patient was seen by orthopedist Dr. Lajoyce Corners on 11/7 he ordered MRI which is scheduled for 11/25 as he suspects patient will require intervention possibly TMA.  He returned started on 11/21.  In that setting would continue cefadroxil -Labs todya -Follow-up with Dr. Elinor Parkinson on 12/3  Danelle Earthly, MD [facility]   05/09/23  9:09 AM  I have personally spent 45 minutes involved in face-to-face and non-face-to-face activities for this patient on the day of the visit. Professional time spent includes the following activities: Preparing to see the patient (review of tests), Obtaining and/or reviewing separately obtained history (admission/discharge record), Performing a medically appropriate examination and/or evaluation , Ordering medications/tests/procedures, referring and communicating with other health care professionals, Documenting clinical information in the EMR, Independently interpreting results (not separately reported), Communicating results to the patient/family/caregiver, Counseling and educating the patient/family/caregiver and Care coordination (not separately reported).

## 2023-05-10 LAB — CBC WITH DIFFERENTIAL/PLATELET
Absolute Lymphocytes: 597 {cells}/uL — ABNORMAL LOW (ref 850–3900)
Absolute Monocytes: 504 {cells}/uL (ref 200–950)
Basophils Absolute: 9 {cells}/uL (ref 0–200)
Basophils Relative: 0.3 %
Eosinophils Absolute: 51 {cells}/uL (ref 15–500)
Eosinophils Relative: 1.7 %
HCT: 44.5 % (ref 38.5–50.0)
Hemoglobin: 13.9 g/dL (ref 13.2–17.1)
MCH: 28.2 pg (ref 27.0–33.0)
MCHC: 31.2 g/dL — ABNORMAL LOW (ref 32.0–36.0)
MCV: 90.3 fL (ref 80.0–100.0)
MPV: 11 fL (ref 7.5–12.5)
Monocytes Relative: 16.8 %
Neutro Abs: 1839 {cells}/uL (ref 1500–7800)
Neutrophils Relative %: 61.3 %
Platelets: 201 10*3/uL (ref 140–400)
RBC: 4.93 10*6/uL (ref 4.20–5.80)
RDW: 13.4 % (ref 11.0–15.0)
Total Lymphocyte: 19.9 %
WBC: 3 10*3/uL — ABNORMAL LOW (ref 3.8–10.8)

## 2023-05-10 LAB — COMPREHENSIVE METABOLIC PANEL
AG Ratio: 1.3 (calc) (ref 1.0–2.5)
ALT: 53 U/L — ABNORMAL HIGH (ref 9–46)
AST: 31 U/L (ref 10–40)
Albumin: 4.3 g/dL (ref 3.6–5.1)
Alkaline phosphatase (APISO): 62 U/L (ref 36–130)
BUN: 15 mg/dL (ref 7–25)
CO2: 29 mmol/L (ref 20–32)
Calcium: 9.3 mg/dL (ref 8.6–10.3)
Chloride: 103 mmol/L (ref 98–110)
Creat: 0.94 mg/dL (ref 0.60–1.29)
Globulin: 3.4 g/dL (ref 1.9–3.7)
Glucose, Bld: 137 mg/dL — ABNORMAL HIGH (ref 65–99)
Potassium: 4 mmol/L (ref 3.5–5.3)
Sodium: 138 mmol/L (ref 135–146)
Total Bilirubin: 0.4 mg/dL (ref 0.2–1.2)
Total Protein: 7.7 g/dL (ref 6.1–8.1)

## 2023-05-10 LAB — C-REACTIVE PROTEIN: CRP: <3 mg/L (ref <8.0)

## 2023-05-10 LAB — SEDIMENTATION RATE: Sed Rate: 9 mm/h (ref 0–15)

## 2023-05-20 ENCOUNTER — Ambulatory Visit
Admission: RE | Admit: 2023-05-20 | Discharge: 2023-05-20 | Disposition: A | Discharge status: Home / Self Care | Payer: BC Managed Care – PPO | Source: Ambulatory Visit | Attending: Orthopedic Surgery | Admitting: Orthopedic Surgery

## 2023-05-20 DIAGNOSIS — L02612 Cutaneous abscess of left foot: Secondary | ICD-10-CM

## 2023-05-20 DIAGNOSIS — M86179 Other acute osteomyelitis, unspecified ankle and foot: Secondary | ICD-10-CM

## 2023-05-20 DIAGNOSIS — M868X7 Other osteomyelitis, ankle and foot: Secondary | ICD-10-CM | POA: Diagnosis not present

## 2023-05-27 ENCOUNTER — Ambulatory Visit: Payer: BC Managed Care – PPO | Admitting: Orthopedic Surgery

## 2023-05-28 ENCOUNTER — Ambulatory Visit: Payer: Self-pay | Admitting: Infectious Diseases

## 2023-06-03 ENCOUNTER — Encounter: Payer: Self-pay | Admitting: Orthopedic Surgery

## 2023-06-03 ENCOUNTER — Ambulatory Visit (INDEPENDENT_AMBULATORY_CARE_PROVIDER_SITE_OTHER): Payer: BC Managed Care – PPO | Admitting: Orthopedic Surgery

## 2023-06-03 DIAGNOSIS — M86272 Subacute osteomyelitis, left ankle and foot: Secondary | ICD-10-CM

## 2023-06-03 NOTE — Progress Notes (Signed)
Office Visit Note   Patient: Jerome Sutton           Date of Birth: 07/29/1980           MRN: [id number] Visit Date: 06/03/2023              Requested by: No referring provider defined for this encounter. PCP: Patient, No Pcp Per  Chief Complaint  Patient presents with   Left Foot - Follow-up    MRI review      HPI: Patient is a 42 year old gentleman who is seen for evaluation of the left foot status post MRI scan.  Patient is status post partial ray amputation.  Assessment & Plan: Visit Diagnoses:  1. Subacute osteomyelitis of left foot (HCC)     Plan: Recommended proceeding with a transmetatarsal amputation with complete excision of the fifth ray.  Risks and benefits were discussed including persistent infection nonhealing the wound and need for additional surgery.  Patient states he understands wished to proceed at this time.  Patient states he would like to proceed with surgery on Friday.  He is currently scheduled to return to work January 7 we will need to extend his return to work.  Follow-Up Instructions: Return in about 2 weeks (around 06/17/2023).   Ortho Exam  Patient is alert, oriented, no adenopathy, well-dressed, normal affect, normal respiratory effort. Examination patient has a palpable dorsalis pedis pulse.  He has bony prominence beneath the first and second metatarsal head with ulceration.  Patient also has a prominence of the fifth metatarsal head with ulceration as well.  There is tenderness and swelling along the lateral aspect of the foot as well as over the forefoot.  Patient has a plantarflexed first ray.  Review of the MRI scan shows abscess in the first webspace as well as osteomyelitis involving the base of the fifth metatarsal.  Imaging: No results found. No images are attached to the encounter.  Labs: Lab Results  Component Value Date   HGBA1C 4.9 04/02/2023   ESRSEDRATE 9 05/09/2023   ESRSEDRATE 22 (H) 04/19/2023   ESRSEDRATE 48 (H)  04/02/2023   CRP <3.0 05/09/2023   CRP <3.0 04/19/2023   CRP 0.7 04/02/2023   LABURIC 5.0 07/03/2017   REPTSTATUS 04/11/2023 FINAL 04/02/2023   GRAMSTAIN  04/02/2023    RARE WBC PRESENT,BOTH PMN AND MONONUCLEAR NO ORGANISMS SEEN    CULT  04/02/2023    FEW STAPHYLOCOCCUS AUREUS FEW STAPHYLOCOCCUS EPIDERMIDIS NO ANAEROBES ISOLATED Performed at [facility], [address], [place], [place] [address]    [facility] STAPHYLOCOCCUS AUREUS 04/02/2023   LABORGA STAPHYLOCOCCUS EPIDERMIDIS 04/02/2023     Lab Results  Component Value Date   ALBUMIN 3.2 (L) 04/01/2023    No results found for: "MG" No results found for: "VD25OH"  No results found for: "PREALBUMIN"    Latest Ref Rng & Units 05/09/2023    9:31 AM 04/19/2023    9:44 AM 04/02/2023    5:07 AM  CBC EXTENDED  WBC 3.8 - 10.8 Thousand/uL 3.0  6.6  4.3   RBC 4.20 - 5.80 Million/uL 4.93  4.69  4.41   Hemoglobin 13.2 - 17.1 g/dL 29.5  28.4  13.2   HCT 38.5 - 50.0 % 44.5  42.0  40.1   Platelets 140 - 400 Thousand/uL 201  188  305   NEUT# 1,500 - 7,800 cells/uL 1,839        There is no height or weight on file to calculate BMI.  Orders:  No orders of the defined types were placed in this encounter.  No orders of the defined types were placed in this encounter.    Procedures: No procedures performed  Clinical Data: No additional findings.  ROS:  All other systems negative, except as noted in the HPI. Review of Systems  Objective: Vital Signs: There were no vitals taken for this visit.  Specialty Comments:  No specialty comments available.  PMFS History: Patient Active Problem List   Diagnosis Date Noted   Medication management 04/19/2023   Acute osteomyelitis of foot (HCC) 04/01/2023   Septic arthritis of left foot (HCC) 04/01/2023   Osteomyelitis of left foot (HCC) 07/04/2017   Abscess of foot left s/p I and D 07/04/17 07/03/2017   History reviewed. No pertinent past medical history.  History  reviewed. No pertinent family history.  Past Surgical History:  Procedure Laterality Date   BACK SURGERY     INCISION AND DRAINAGE ABSCESS Left 07/04/2017   Procedure: INCISION AND DRAINAGE ABSCESS LEFT FOOT AND FOURTH METATARSOPHALANGEAL BONE RESECTION AND MANUAL MANIPULATION OF SECOND AND THIRD TOES;  Surgeon: Vickki Hearing, MD;  Location: [facility];  Service: Orthopedics;  Laterality: Left;   INCISION AND DRAINAGE ABSCESS Left 04/02/2023   Procedure: INCISION AND DRAINAGE ABSCESS;  Surgeon: Vickki Hearing, MD;  Location: [facility];  Service: Orthopedics;  Laterality: Left;  left foot   LUMBAR LAMINECTOMY/DECOMPRESSION MICRODISCECTOMY  02/18/2012   Procedure: LUMBAR LAMINECTOMY/DECOMPRESSION MICRODISCECTOMY 1 LEVEL;  Surgeon: Mariam Dollar, MD;  Location: [facility];  Service: Neurosurgery;  Laterality: Bilateral;  Bilateral Lumbar Laminectomy and diskectomy   Social History   Occupational History   Not on file  Tobacco Use   Smoking status: Never   Smokeless tobacco: Never  Vaping Use   Vaping status: Never Used  Substance and Sexual Activity   Alcohol use: No   Drug use: No   Sexual activity: Not on file

## 2023-06-06 ENCOUNTER — Encounter (HOSPITAL_COMMUNITY): Payer: Self-pay | Admitting: Orthopedic Surgery

## 2023-06-06 ENCOUNTER — Other Ambulatory Visit: Payer: Self-pay

## 2023-06-06 NOTE — Progress Notes (Signed)
PCP - NO PCP Cardiologist - denies  PPM/ICD -denies Device Orders -n/a Rep Notified - n/a  Chest x-ray - denies EKG - denies Stress Test - denies ECHO - denies Cardiac Cath - denies  CPAP - denies  DM - denies  Blood Thinner Instructions: denies Aspirin Instructions: n./a  ERAS Protcol - clear liquids until 4:30  COVID TEST- no  Anesthesia review: no  Patient verbally denies any shortness of breath, fever, cough and chest pain during phone call   -------------  SDW INSTRUCTIONS given:  Your procedure is scheduled on June 07, 2023.  Report to [facility] Main Entrance "A" at 5:30 A.M., and check in at the Admitting office.  Call this number if you have problems the morning of surgery:  [phone]   Remember:  Do not eat after midnight the night before your surgery  You may drink clear liquids until 4:30 the morning of your surgery.   Clear liquids allowed are: Water, Non-Citrus Juices (without pulp), Carbonated Beverages, Clear Tea, Black Coffee Only, and Gatorade    Take these medicines the morning of surgery with A SIP OF WATER none  As of today, STOP taking any Aspirin (unless otherwise instructed by your surgeon) Aleve, Naproxen, Ibuprofen, Motrin, Advil, Goody's, BC's, all herbal medications, fish oil, and all vitamins.                      Do not wear jewelry, make up, or nail polish            Do not wear lotions, powders, perfumes/colognes, or deodorant.            Do not shave 48 hours prior to surgery.  Men may shave face and neck.            Do not bring valuables to the hospital.            [facility] is not responsible for any belongings or valuables.  Do NOT Smoke (Tobacco/Vaping) 24 hours prior to your procedure If you use a CPAP at night, you may bring all equipment for your overnight stay.   Contacts, glasses, dentures or bridgework may not be worn into surgery.      For patients admitted to the hospital, discharge time will be  determined by your treatment team.   Patients discharged the day of surgery will not be allowed to drive home, and someone needs to stay with them for 24 hours.    Special instructions:   [place]- Preparing For Surgery  Before surgery, you can play an important role. Because skin is not sterile, your skin needs to be as free of germs as possible. You can reduce the number of germs on your skin by washing with CHG (chlorahexidine gluconate) Soap before surgery.  CHG is an antiseptic cleaner which kills germs and bonds with the skin to continue killing germs even after washing.    Oral Hygiene is also important to reduce your risk of infection.  Remember - BRUSH YOUR TEETH THE MORNING OF SURGERY WITH YOUR REGULAR TOOTHPASTE  Please do not use if you have an allergy to CHG or antibacterial soaps. If your skin becomes reddened/irritated stop using the CHG.  Do not shave (including legs and underarms) for at least 48 hours prior to first CHG shower. It is OK to shave your face.  Please follow these instructions carefully.   Shower the NIGHT BEFORE SURGERY and the MORNING OF SURGERY with DIAL Soap.  Pat yourself dry with a CLEAN TOWEL.  Wear CLEAN PAJAMAS to bed the night before surgery  Place CLEAN SHEETS on your bed the night of your first shower and DO NOT SLEEP WITH PETS.   Day of Surgery: Please shower morning of surgery  Wear Clean/Comfortable clothing the morning of surgery Do not apply any deodorants/lotions.   Remember to brush your teeth WITH YOUR REGULAR TOOTHPASTE.   Questions were answered. Patient verbalized understanding of instructions.

## 2023-06-07 ENCOUNTER — Encounter (HOSPITAL_COMMUNITY)
Admission: RE | Disposition: A | Discharge status: Home / Self Care | Payer: Self-pay | Source: Home / Self Care | Attending: Orthopedic Surgery

## 2023-06-07 ENCOUNTER — Other Ambulatory Visit: Payer: Self-pay

## 2023-06-07 ENCOUNTER — Encounter (HOSPITAL_COMMUNITY): Payer: Self-pay

## 2023-06-07 ENCOUNTER — Encounter (HOSPITAL_COMMUNITY): Payer: Self-pay | Admitting: Orthopedic Surgery

## 2023-06-07 ENCOUNTER — Telehealth: Payer: Self-pay | Admitting: Radiology

## 2023-06-07 ENCOUNTER — Ambulatory Visit (HOSPITAL_COMMUNITY)
Admission: RE | Admit: 2023-06-07 | Discharge: 2023-06-07 | Disposition: A | Discharge status: Home / Self Care | Payer: BC Managed Care – PPO | Attending: Orthopedic Surgery | Admitting: Orthopedic Surgery

## 2023-06-07 ENCOUNTER — Ambulatory Visit (HOSPITAL_COMMUNITY): Payer: BC Managed Care – PPO | Admitting: Certified Registered Nurse Anesthetist

## 2023-06-07 DIAGNOSIS — Z4801 Encounter for change or removal of surgical wound dressing: Secondary | ICD-10-CM | POA: Diagnosis not present

## 2023-06-07 DIAGNOSIS — L02619 Cutaneous abscess of unspecified foot: Secondary | ICD-10-CM

## 2023-06-07 DIAGNOSIS — L97529 Non-pressure chronic ulcer of other part of left foot with unspecified severity: Secondary | ICD-10-CM | POA: Diagnosis not present

## 2023-06-07 DIAGNOSIS — L02612 Cutaneous abscess of left foot: Secondary | ICD-10-CM | POA: Diagnosis not present

## 2023-06-07 DIAGNOSIS — M86272 Subacute osteomyelitis, left ankle and foot: Secondary | ICD-10-CM | POA: Diagnosis not present

## 2023-06-07 DIAGNOSIS — M86172 Other acute osteomyelitis, left ankle and foot: Secondary | ICD-10-CM | POA: Diagnosis not present

## 2023-06-07 DIAGNOSIS — Y828 Other medical devices associated with adverse incidents: Secondary | ICD-10-CM | POA: Diagnosis not present

## 2023-06-07 DIAGNOSIS — G8918 Other acute postprocedural pain: Secondary | ICD-10-CM | POA: Diagnosis not present

## 2023-06-07 DIAGNOSIS — M96830 Postprocedural hemorrhage and hematoma of a musculoskeletal structure following a musculoskeletal system procedure: Secondary | ICD-10-CM | POA: Diagnosis not present

## 2023-06-07 HISTORY — PX: AMPUTATION: SHX166

## 2023-06-07 SURGERY — AMPUTATION, FOOT, PARTIAL
Anesthesia: General | Site: Foot | Laterality: Left

## 2023-06-07 MED ORDER — OXYCODONE-ACETAMINOPHEN 5-325 MG PO TABS: 1.0000 | ORAL_TABLET | ORAL | 0 refills | Status: DC | PRN

## 2023-06-07 MED ORDER — FENTANYL CITRATE (PF) 100 MCG/2ML IJ SOLN: 25.0000 ug | INTRAMUSCULAR | Status: DC | PRN

## 2023-06-07 MED ORDER — SODIUM CHLORIDE 0.9 % IV SOLN: INTRAVENOUS | Status: DC

## 2023-06-07 MED ORDER — DEXMEDETOMIDINE HCL IN NACL 80 MCG/20ML IV SOLN
INTRAVENOUS | Status: DC | PRN
  Administered 2023-06-07 (×3): 4 ug via INTRAVENOUS

## 2023-06-07 MED ORDER — VASHE WOUND IRRIGATION OPTIME
TOPICAL | Status: DC | PRN
  Administered 2023-06-07: 34 [oz_av]

## 2023-06-07 MED ORDER — CHLORHEXIDINE GLUCONATE 0.12 % MT SOLN: 15.0000 mL | Freq: Once | OROMUCOSAL | Status: AC

## 2023-06-07 MED ORDER — OXYCODONE HCL 5 MG/5ML PO SOLN: 5.0000 mg | Freq: Once | ORAL | Status: DC | PRN

## 2023-06-07 MED ORDER — ORAL CARE MOUTH RINSE: 15.0000 mL | Freq: Once | OROMUCOSAL | Status: AC

## 2023-06-07 MED ORDER — CEFAZOLIN SODIUM-DEXTROSE 2-4 GM/100ML-% IV SOLN
2.0000 g | INTRAVENOUS | Status: AC
  Administered 2023-06-07: 2 g via INTRAVENOUS
  Filled 2023-06-07: qty 100

## 2023-06-07 MED ORDER — LIDOCAINE HCL (PF) 1 % IJ SOLN
INTRAMUSCULAR | Status: AC
  Filled 2023-06-07: qty 30

## 2023-06-07 MED ORDER — ONDANSETRON HCL 4 MG/2ML IJ SOLN
4.0000 mg | Freq: Four times a day (QID) | INTRAMUSCULAR | Status: DC | PRN
Start: 2023-06-07 — End: 2023-06-07

## 2023-06-07 MED ORDER — 0.9 % SODIUM CHLORIDE (POUR BTL) OPTIME
TOPICAL | Status: DC | PRN
  Administered 2023-06-07: 1000 mL

## 2023-06-07 MED ORDER — PROPOFOL 10 MG/ML IV BOLUS
INTRAVENOUS | Status: AC
  Filled 2023-06-07: qty 20

## 2023-06-07 MED ORDER — MIDAZOLAM HCL 2 MG/2ML IJ SOLN
INTRAMUSCULAR | Status: AC
  Filled 2023-06-07: qty 2

## 2023-06-07 MED ORDER — CHLORHEXIDINE GLUCONATE 0.12 % MT SOLN
OROMUCOSAL | Status: AC
  Administered 2023-06-07: 15 mL via OROMUCOSAL
  Filled 2023-06-07: qty 15

## 2023-06-07 MED ORDER — MIDAZOLAM HCL 5 MG/5ML IJ SOLN
INTRAMUSCULAR | Status: DC | PRN
  Administered 2023-06-07: 2 mg via INTRAVENOUS

## 2023-06-07 MED ORDER — FENTANYL CITRATE (PF) 250 MCG/5ML IJ SOLN
INTRAMUSCULAR | Status: DC | PRN
  Administered 2023-06-07 (×2): 50 ug via INTRAVENOUS

## 2023-06-07 MED ORDER — BUPIVACAINE HCL (PF) 0.5 % IJ SOLN
INTRAMUSCULAR | Status: DC | PRN
  Administered 2023-06-07: 25 mL via PERINEURAL

## 2023-06-07 MED ORDER — PROPOFOL 500 MG/50ML IV EMUL
INTRAVENOUS | Status: DC | PRN
  Administered 2023-06-07: 100 ug/kg/min via INTRAVENOUS

## 2023-06-07 MED ORDER — PROPOFOL 10 MG/ML IV BOLUS
INTRAVENOUS | Status: DC | PRN
  Administered 2023-06-07: 50 mg via INTRAVENOUS

## 2023-06-07 MED ORDER — OXYCODONE HCL 5 MG PO TABS: 5.0000 mg | ORAL_TABLET | Freq: Once | ORAL | Status: DC | PRN

## 2023-06-07 MED ORDER — FENTANYL CITRATE (PF) 250 MCG/5ML IJ SOLN
INTRAMUSCULAR | Status: AC
  Filled 2023-06-07: qty 5

## 2023-06-07 SURGICAL SUPPLY — 33 items
BAG COUNTER SPONGE SURGICOUNT (BAG) ×1 IMPLANT
BENZOIN TINCTURE PRP APPL 2/3 (GAUZE/BANDAGES/DRESSINGS) ×1 IMPLANT
BLADE SAW SGTL HD 18.5X60.5X1. (BLADE) ×1 IMPLANT
BLADE SURG 21 STRL SS (BLADE) ×1 IMPLANT
BNDG COHESIVE 4X5 TAN STRL (GAUZE/BANDAGES/DRESSINGS) IMPLANT
BNDG COHESIVE 4X5 TAN STRL LF (GAUZE/BANDAGES/DRESSINGS) IMPLANT
BNDG GAUZE DERMACEA FLUFF 4 (GAUZE/BANDAGES/DRESSINGS) IMPLANT
CANISTER WOUND CARE 500ML ATS (WOUND CARE) IMPLANT
CLEANSER WND VASHE 34 (WOUND CARE) IMPLANT
COVER SURGICAL LIGHT HANDLE (MISCELLANEOUS) ×1 IMPLANT
DRAPE INCISE IOBAN 66X45 STRL (DRAPES) ×1 IMPLANT
DRAPE U-SHAPE 47X51 STRL (DRAPES) ×1 IMPLANT
DRSG ADAPTIC 3X8 NADH LF (GAUZE/BANDAGES/DRESSINGS) IMPLANT
DURAPREP 26ML APPLICATOR (WOUND CARE) ×1 IMPLANT
ELECT REM PT RETURN 9FT ADLT (ELECTROSURGICAL) ×1
ELECTRODE REM PT RTRN 9FT ADLT (ELECTROSURGICAL) ×1 IMPLANT
GAUZE PAD ABD 8X10 STRL (GAUZE/BANDAGES/DRESSINGS) IMPLANT
GAUZE SPONGE 4X4 12PLY STRL (GAUZE/BANDAGES/DRESSINGS) IMPLANT
GLOVE BIOGEL PI IND STRL 9 (GLOVE) ×1 IMPLANT
GLOVE SURG ORTHO 9.0 STRL STRW (GLOVE) ×1 IMPLANT
GOWN STRL REUS W/ TWL XL LVL3 (GOWN DISPOSABLE) ×3 IMPLANT
KIT BASIN OR (CUSTOM PROCEDURE TRAY) ×1 IMPLANT
KIT TURNOVER KIT B (KITS) ×1 IMPLANT
NS IRRIG 1000ML POUR BTL (IV SOLUTION) ×1 IMPLANT
PACK ORTHO EXTREMITY (CUSTOM PROCEDURE TRAY) ×1 IMPLANT
PAD ARMBOARD 7.5X6 YLW CONV (MISCELLANEOUS) ×2 IMPLANT
SPONGE T-LAP 18X18 ~~LOC~~+RFID (SPONGE) IMPLANT
SUT ETHILON 2 0 PSLX (SUTURE) ×2 IMPLANT
TOWEL GREEN STERILE (TOWEL DISPOSABLE) ×1 IMPLANT
TOWEL GREEN STERILE FF (TOWEL DISPOSABLE) ×1 IMPLANT
TUBE CONNECTING 12X1/4 (SUCTIONS) ×1 IMPLANT
WATER STERILE IRR 1000ML POUR (IV SOLUTION) ×1 IMPLANT
YANKAUER SUCT BULB TIP NO VENT (SUCTIONS) ×1 IMPLANT

## 2023-06-07 NOTE — Anesthesia Preprocedure Evaluation (Signed)
Anesthesia Evaluation  Patient identified by MRN, date of birth, ID band Patient awake    Reviewed: Allergy & Precautions, H&P , NPO status , Patient's Chart, lab work & pertinent test results  Airway Mallampati: II   Neck ROM: full    Dental   Pulmonary neg pulmonary ROS   breath sounds clear to auscultation       Cardiovascular negative cardio ROS  Rhythm:regular Rate:Normal     Neuro/Psych    GI/Hepatic   Endo/Other    Renal/GU      Musculoskeletal  (+) Arthritis ,    Abdominal   Peds  Hematology   Anesthesia Other Findings   Reproductive/Obstetrics                             Anesthesia Physical Anesthesia Plan  ASA: 2  Anesthesia Plan: General   Post-op Pain Management: Regional block*   Induction: Intravenous  PONV Risk Score and Plan: 2 and Ondansetron, Dexamethasone, Midazolam and Treatment may vary due to age or medical condition  Airway Management Planned: LMA  Additional Equipment:   Intra-op Plan:   Post-operative Plan: Extubation in OR  Informed Consent: I have reviewed the patients History and Physical, chart, labs and discussed the procedure including the risks, benefits and alternatives for the proposed anesthesia with the patient or authorized representative who has indicated his/her understanding and acceptance.     Dental advisory given  Plan Discussed with: CRNA, Anesthesiologist and Surgeon  Anesthesia Plan Comments:        Anesthesia Quick Evaluation

## 2023-06-07 NOTE — Op Note (Signed)
06/07/2023  8:06 AM  PATIENT:  Jerome Sutton    PRE-OPERATIVE DIAGNOSIS:  Osteomyelitis and Abscess Left Foot  POST-OPERATIVE DIAGNOSIS:  Same  PROCEDURE:  LEFT TRANSMETATARSAL AMPUTATION  AMPUTATION OF LEFT 5TH METATARSAL  SURGEON:  Nadara Mustard, MD  PHYSICIAN ASSISTANT:None ANESTHESIA:   General  PREOPERATIVE INDICATIONS:  Jerome Sutton is a  42 y.o. male with a diagnosis of Osteomyelitis and Abscess Left Foot who failed conservative measures and elected for surgical management.    The risks benefits and alternatives were discussed with the patient preoperatively including but not limited to the risks of infection, bleeding, nerve injury, cardiopulmonary complications, the need for revision surgery, among others, and the patient was willing to proceed.  OPERATIVE IMPLANTS:   * No implants in log *  @ENCIMAGES @  OPERATIVE FINDINGS: Tissue margins were clear no abscess good petechial bleeding.  OPERATIVE PROCEDURE: Patient was brought the operating room and underwent a regional anesthetic.  After adequate of the anesthesia obtained patient's left lower extremity was prepped using DuraPrep draped into a sterile field a timeout was called.  A fishmouth incision was made just proximal to the ulcers that extended to bone.  A oscillating saw was used to perform a transmetatarsal amputation.  Electrocautery was used for hemostasis.  Patient also had osteomyelitis involving the base of the fifth metatarsal and an incision was made laterally extending to the base of the fifth metatarsal and the fifth metatarsal was resected in 1 block of tissue.  Electrocautery was used hemostasis.  The wound was irrigated with Vashe.  The incision closed using 2-0 nylon a sterile dressing was applied patient was taken the PACU in stable condition.   DISCHARGE PLANNING:  Antibiotic duration: Preoperative antibiotics  Weightbearing: Touchdown weightbearing on the left  Pain medication:  Prescription for Percocet  Dressing care/ Wound VAC: Dry dressing   Ambulatory devices: Crutches  Discharge to: Home.  Follow-up: In the office 1 week post operative.

## 2023-06-07 NOTE — Telephone Encounter (Signed)
This pt was a left transmet amputation this morning please see message below and call.

## 2023-06-07 NOTE — Telephone Encounter (Signed)
Pt has an appt on 06/13/2023

## 2023-06-07 NOTE — Telephone Encounter (Signed)
Received phone call from answering service.  Jamie with [facility] at [facility] states patient had surgery this morning, left the hospital and is now at [facility] due to bleeding post op complications. Dr. Milagros Evener would like a peer to peer. Please call [phone].

## 2023-06-07 NOTE — H&P (Signed)
Jerome Sutton is an 42 y.o. male.   Chief Complaint: Ulceration left foot. HPI: Patient is a 42 year old gentleman who has ulceration and osteomyelitis of the left foot.  He is status post an MRI scan.  Patient is status post partial ray amputation.  History reviewed. No pertinent past medical history.  Past Surgical History:  Procedure Laterality Date   BACK SURGERY     INCISION AND DRAINAGE ABSCESS Left 07/04/2017   Procedure: INCISION AND DRAINAGE ABSCESS LEFT FOOT AND FOURTH METATARSOPHALANGEAL BONE RESECTION AND MANUAL MANIPULATION OF SECOND AND THIRD TOES;  Surgeon: Vickki Hearing, MD;  Location: [facility];  Service: Orthopedics;  Laterality: Left;   INCISION AND DRAINAGE ABSCESS Left 04/02/2023   Procedure: INCISION AND DRAINAGE ABSCESS;  Surgeon: Vickki Hearing, MD;  Location: [facility];  Service: Orthopedics;  Laterality: Left;  left foot   LUMBAR LAMINECTOMY/DECOMPRESSION MICRODISCECTOMY  02/18/2012   Procedure: LUMBAR LAMINECTOMY/DECOMPRESSION MICRODISCECTOMY 1 LEVEL;  Surgeon: Mariam Dollar, MD;  Location: [facility];  Service: Neurosurgery;  Laterality: Bilateral;  Bilateral Lumbar Laminectomy and diskectomy    History reviewed. No pertinent family history. Social History:  reports that he has never smoked. He has never used smokeless tobacco. He reports that he does not drink alcohol and does not use drugs.  Allergies: No Known Allergies  No medications prior to admission.    No results found for this or any previous visit (from the past 48 hours). No results found.  Review of Systems  All other systems reviewed and are negative.   Blood pressure 125/85, pulse 91, temperature 98.4 F (36.9 C), temperature source Oral, resp. rate 20, height 5\' 7"  (1.702 m), weight 98.9 kg, SpO2 97%. Physical Exam  Patient is alert, oriented, no adenopathy, well-dressed, normal affect, normal respiratory effort. Examination patient has a palpable dorsalis pedis pulse.  He has bony  prominence beneath the first and second metatarsal head with ulceration.  Patient also has a prominence of the fifth metatarsal head with ulceration as well.  There is tenderness and swelling along the lateral aspect of the foot as well as over the forefoot.  Patient has a plantarflexed first ray.  Review of the MRI scan shows abscess in the first webspace as well as osteomyelitis involving the base of the fifth metatarsal. Assessment/Plan 1. Subacute osteomyelitis of left foot (HCC)       Plan: Recommended proceeding with a transmetatarsal amputation with complete excision of the fifth ray.  Risks and benefits were discussed including persistent infection nonhealing the wound and need for additional surgery.  Patient states he understands wished to proceed at this time.  Patient states he would like to proceed with surgery  Nadara Mustard, MD 06/07/2023, 6:45 AM

## 2023-06-07 NOTE — Anesthesia Procedure Notes (Signed)
Anesthesia Regional Block: Popliteal block   Pre-Anesthetic Checklist: , timeout performed,  Correct Patient, Correct Site, Correct Laterality,  Correct Procedure, Correct Position, site marked,  Risks and benefits discussed,  Surgical consent,  Pre-op evaluation,  At surgeon's request and post-op pain management  Laterality: Left  Prep: chloraprep       Needles:  Injection technique: Single-shot  Needle Type: Echogenic Stimulator Needle          Additional Needles:   Procedures:, nerve stimulator,,,,,     Nerve Stimulator or Paresthesia:  Response: plantar flexion of foot, 0.45 mA  Additional Responses:   Narrative:  Start time: 06/07/2023 7:15 AM End time: 06/07/2023 7:22 AM Injection made incrementally with aspirations every 5 mL.  Performed by: Personally  Anesthesiologist: Achille Rich, MD  Additional Notes: Functioning IV was confirmed and monitors were applied.  A 90mm 21ga Arrow echogenic stimulator needle was used. Sterile prep and drape,hand hygiene and sterile gloves were used.  Negative aspiration and negative test dose prior to incremental administration of local anesthetic. The patient tolerated the procedure well.  Ultrasound guidance: relevent anatomy identified, needle position confirmed, local anesthetic spread visualized around nerve(s), vascular puncture avoided.  Image printed for medical record.

## 2023-06-07 NOTE — Transfer of Care (Signed)
Immediate Anesthesia Transfer of Care Note  Patient: Jerome Sutton  Procedure(s) Performed: LEFT TRANSMETATARSAL AMPUTATION WITH AMPUTATION OF LEFT 5TH METATARSAL (Left: Foot)  Patient Location: PACU  Anesthesia Type:MAC and Regional  Level of Consciousness: drowsy and patient cooperative  Airway & Oxygen Therapy: Patient Spontanous Breathing  Post-op Assessment: Report given to RN, Post -op Vital signs reviewed and stable, and Patient moving all extremities X 4  Post vital signs: Reviewed and stable  Last Vitals:  Vitals Value Taken Time  BP 117/75 06/07/23 0804  Temp    Pulse 69 06/07/23 0808  Resp 14 06/07/23 0808  SpO2 96 % 06/07/23 0808  Vitals shown include unfiled device data.  Last Pain:  Vitals:   06/07/23 0623  TempSrc:   PainSc: 0-No pain         Complications: No notable events documented.

## 2023-06-07 NOTE — Anesthesia Postprocedure Evaluation (Signed)
Anesthesia Post Note  Patient: Jerome Sutton  Procedure(s) Performed: LEFT TRANSMETATARSAL AMPUTATION WITH AMPUTATION OF LEFT 5TH METATARSAL (Left: Foot)     Patient location during evaluation: PACU Anesthesia Type: General and MAC Level of consciousness: awake and alert Pain management: pain level controlled Vital Signs Assessment: post-procedure vital signs reviewed and stable Respiratory status: spontaneous breathing, nonlabored ventilation, respiratory function stable and patient connected to nasal cannula oxygen Cardiovascular status: stable and blood pressure returned to baseline Postop Assessment: no apparent nausea or vomiting Anesthetic complications: no   No notable events documented.  Last Vitals:  Vitals:   06/07/23 0815 06/07/23 0830  BP: 119/79 112/84  Pulse: 71 71  Resp: 17 18  Temp:  36.6 C  SpO2: 95% 96%    Last Pain:  Vitals:   06/07/23 0830  TempSrc:   PainSc: 0-No pain                 [name] S

## 2023-06-07 NOTE — Progress Notes (Signed)
Orthopedic Tech Progress Note Patient Details:  Jerome Sutton 05/01/1981 [id number]   Post op shoe applied, crutch training provided  Ortho Devices Type of Ortho Device: Crutches, Postop shoe/boot Ortho Device/Splint Location: LLE Ortho Device/Splint Interventions: Ordered, Application, Adjustment   Post Interventions Patient Tolerated: Well Instructions Provided: Adjustment of device, Care of device  Diannia Ruder 06/07/2023, 8:44 AM

## 2023-06-08 ENCOUNTER — Encounter (HOSPITAL_COMMUNITY): Payer: Self-pay | Admitting: Orthopedic Surgery

## 2023-06-13 ENCOUNTER — Ambulatory Visit (INDEPENDENT_AMBULATORY_CARE_PROVIDER_SITE_OTHER): Payer: BC Managed Care – PPO | Admitting: Orthopedic Surgery

## 2023-06-13 DIAGNOSIS — Z89432 Acquired absence of left foot: Secondary | ICD-10-CM

## 2023-06-13 MED ORDER — OXYCODONE-ACETAMINOPHEN 5-325 MG PO TABS: 1.0000 | ORAL_TABLET | ORAL | 0 refills | Status: AC | PRN

## 2023-06-16 ENCOUNTER — Encounter: Payer: Self-pay | Admitting: Orthopedic Surgery

## 2023-06-16 NOTE — Progress Notes (Signed)
Office Visit Note   Patient: Jerome Sutton           Date of Birth: [birth year]           MRN: [id number] Visit Date: 06/13/2023              Requested by: No referring provider defined for this encounter. PCP: Patient, No Pcp Per  Chief Complaint  Patient presents with   Left Foot - Routine Post Op    06/07/2023 left TMA and 5th ray amputation       HPI: Patient is a 42 year old gentleman who is 1 week status post left transmetatarsal amputation and fifth ray amputation.  Patient went to the emergency room after surgery secondary to full weightbearing causing bleeding.  Assessment & Plan: Visit Diagnoses:  1. History of transmetatarsal amputation of left foot (HCC)     Plan: Continue protected weightbearing prescription for Percocet.  Start Dial soap cleansing.  Postoperative shoe.  Follow-Up Instructions: Return in about 3 weeks (around 07/04/2023).   Ortho Exam  Patient is alert, oriented, no adenopathy, well-dressed, normal affect, normal respiratory effort. Examination the wound edges are healthy and viable.  Patient was provided a note to be out of work for 4 weeks.  Imaging: No results found. No images are attached to the encounter.  Labs: Lab Results  Component Value Date   HGBA1C 4.9 04/02/2023   ESRSEDRATE 9 05/09/2023   ESRSEDRATE 22 (H) 04/19/2023   ESRSEDRATE 48 (H) 04/02/2023   CRP <3.0 05/09/2023   CRP <3.0 04/19/2023   CRP 0.7 04/02/2023   LABURIC 5.0 07/03/2017   REPTSTATUS 04/11/2023 FINAL 04/02/2023   GRAMSTAIN  04/02/2023    RARE WBC PRESENT,BOTH PMN AND MONONUCLEAR NO ORGANISMS SEEN    CULT  04/02/2023    FEW STAPHYLOCOCCUS AUREUS FEW STAPHYLOCOCCUS EPIDERMIDIS NO ANAEROBES ISOLATED Performed at [facility], [address] [place], [place] [address]    [facility] STAPHYLOCOCCUS AUREUS 04/02/2023   LABORGA STAPHYLOCOCCUS EPIDERMIDIS 04/02/2023     Lab Results  Component Value Date   ALBUMIN 3.2 (L) 04/01/2023    No  results found for: "MG" No results found for: "VD25OH"  No results found for: "PREALBUMIN"    Latest Ref Rng & Units 05/09/2023    9:31 AM 04/19/2023    9:44 AM 04/02/2023    5:07 AM  CBC EXTENDED  WBC 3.8 - 10.8 Thousand/uL 3.0  6.6  4.3   RBC 4.20 - 5.80 Million/uL 4.93  4.69  4.41   Hemoglobin 13.2 - 17.1 g/dL 19.1  47.8  29.5   HCT 38.5 - 50.0 % 44.5  42.0  40.1   Platelets 140 - 400 Thousand/uL 201  188  305   NEUT# 1,500 - 7,800 cells/uL 1,839        There is no height or weight on file to calculate BMI.  Orders:  No orders of the defined types were placed in this encounter.  Meds ordered this encounter  Medications   oxyCODONE-acetaminophen (PERCOCET/ROXICET) 5-325 MG tablet    Sig: Take 1 tablet by mouth every 4 (four) hours as needed.    Dispense:  30 tablet    Refill:  0     Procedures: No procedures performed  Clinical Data: No additional findings.  ROS:  All other systems negative, except as noted in the HPI. Review of Systems  Objective: Vital Signs: There were no vitals taken for this visit.  Specialty Comments:  No specialty comments available.  PMFS History: Patient Active Problem List   Diagnosis Date Noted   Medication management 04/19/2023   Acute osteomyelitis of foot (HCC) 04/01/2023   Septic arthritis of left foot (HCC) 04/01/2023   Osteomyelitis of left foot (HCC) 07/04/2017   Abscess of foot left s/p I and D 07/04/17 07/03/2017   History reviewed. No pertinent past medical history.  History reviewed. No pertinent family history.  Past Surgical History:  Procedure Laterality Date   AMPUTATION Left 06/07/2023   Procedure: LEFT TRANSMETATARSAL AMPUTATION WITH AMPUTATION OF LEFT 5TH METATARSAL;  Surgeon: Nadara Mustard, MD;  Location: [facility] OR;  Service: Orthopedics;  Laterality: Left;   BACK SURGERY     INCISION AND DRAINAGE ABSCESS Left 07/04/2017   Procedure: INCISION AND DRAINAGE ABSCESS LEFT FOOT AND FOURTH METATARSOPHALANGEAL BONE  RESECTION AND MANUAL MANIPULATION OF SECOND AND THIRD TOES;  Surgeon: Vickki Hearing, MD;  Location: [facility];  Service: Orthopedics;  Laterality: Left;   INCISION AND DRAINAGE ABSCESS Left 04/02/2023   Procedure: INCISION AND DRAINAGE ABSCESS;  Surgeon: Vickki Hearing, MD;  Location: [facility];  Service: Orthopedics;  Laterality: Left;  left foot   LUMBAR LAMINECTOMY/DECOMPRESSION MICRODISCECTOMY  02/18/2012   Procedure: LUMBAR LAMINECTOMY/DECOMPRESSION MICRODISCECTOMY 1 LEVEL;  Surgeon: Mariam Dollar, MD;  Location: [facility];  Service: Neurosurgery;  Laterality: Bilateral;  Bilateral Lumbar Laminectomy and diskectomy   Social History   Occupational History   Not on file  Tobacco Use   Smoking status: Never   Smokeless tobacco: Never  Vaping Use   Vaping status: Never Used  Substance and Sexual Activity   Alcohol use: No   Drug use: No   Sexual activity: Not on file

## 2023-07-04 ENCOUNTER — Ambulatory Visit (INDEPENDENT_AMBULATORY_CARE_PROVIDER_SITE_OTHER): Payer: BC Managed Care – PPO | Admitting: Orthopedic Surgery

## 2023-07-04 DIAGNOSIS — Z89432 Acquired absence of left foot: Secondary | ICD-10-CM

## 2023-07-08 ENCOUNTER — Encounter: Payer: Self-pay | Admitting: Orthopedic Surgery

## 2023-07-08 NOTE — Progress Notes (Signed)
 Office Visit Note   Patient: Jerome Sutton           Date of Birth: 06/15/1981           MRN: [id number] Visit Date: 07/04/2023              Requested by: No referring provider defined for this encounter. PCP: Patient, No Pcp Per  Chief Complaint  Patient presents with   Left Foot - Routine Post Op    06/07/2023 left TMA 5th ray amputation       HPI: Patient is a 43 year old gentleman who is 4 weeks status post left transmetatarsal amputation and fifth ray amputation.  Patient is full weightbearing.  Assessment & Plan: Visit Diagnoses:  1. History of transmetatarsal amputation of left foot (HCC)     Plan: Sutures harvested.  Protected weightbearing recommended  Follow-Up Instructions: Return in about 2 weeks (around 07/18/2023).   Ortho Exam  Patient is alert, oriented, no adenopathy, well-dressed, normal affect, normal respiratory effort. Examination the incision is well-approximated there is no cellulitis or drainage.  Sutures are harvested.  Imaging: No results found. No images are attached to the encounter.  Labs: Lab Results  Component Value Date   HGBA1C 4.9 04/02/2023   ESRSEDRATE 9 05/09/2023   ESRSEDRATE 22 (H) 04/19/2023   ESRSEDRATE 48 (H) 04/02/2023   CRP <3.0 05/09/2023   CRP <3.0 04/19/2023   CRP 0.7 04/02/2023   LABURIC 5.0 07/03/2017   REPTSTATUS 04/11/2023 FINAL 04/02/2023   GRAMSTAIN  04/02/2023    RARE WBC PRESENT,BOTH PMN AND MONONUCLEAR NO ORGANISMS SEEN    CULT  04/02/2023    FEW STAPHYLOCOCCUS AUREUS FEW STAPHYLOCOCCUS EPIDERMIDIS NO ANAEROBES ISOLATED Performed at [facility], [address], [place], [place] [address]    [facility] STAPHYLOCOCCUS AUREUS 04/02/2023   LABORGA STAPHYLOCOCCUS EPIDERMIDIS 04/02/2023     Lab Results  Component Value Date   ALBUMIN 3.2 (L) 04/01/2023    No results found for: MG No results found for: VD25OH  No results found for: PREALBUMIN    Latest Ref Rng & Units 05/09/2023     9:31 AM 04/19/2023    9:44 AM 04/02/2023    5:07 AM  CBC EXTENDED  WBC 3.8 - 10.8 Thousand/uL 3.0  6.6  4.3   RBC 4.20 - 5.80 Million/uL 4.93  4.69  4.41   Hemoglobin 13.2 - 17.1 g/dL 86.0  86.8  87.6   HCT 38.5 - 50.0 % 44.5  42.0  40.1   Platelets 140 - 400 Thousand/uL 201  188  305   NEUT# 1,500 - 7,800 cells/uL 1,839        There is no height or weight on file to calculate BMI.  Orders:  No orders of the defined types were placed in this encounter.  No orders of the defined types were placed in this encounter.    Procedures: No procedures performed  Clinical Data: No additional findings.  ROS:  All other systems negative, except as noted in the HPI. Review of Systems  Objective: Vital Signs: There were no vitals taken for this visit.  Specialty Comments:  No specialty comments available.  PMFS History: Patient Active Problem List   Diagnosis Date Noted   Medication management 04/19/2023   Acute osteomyelitis of foot (HCC) 04/01/2023   Septic arthritis of left foot (HCC) 04/01/2023   Osteomyelitis of left foot (HCC) 07/04/2017   Abscess of foot left s/p I and D 07/04/17 07/03/2017   History  reviewed. No pertinent past medical history.  History reviewed. No pertinent family history.  Past Surgical History:  Procedure Laterality Date   AMPUTATION Left 06/07/2023   Procedure: LEFT TRANSMETATARSAL AMPUTATION WITH AMPUTATION OF LEFT 5TH METATARSAL;  Surgeon: Harden Jerona GAILS, MD;  Location: [facility] OR;  Service: Orthopedics;  Laterality: Left;   BACK SURGERY     INCISION AND DRAINAGE ABSCESS Left 07/04/2017   Procedure: INCISION AND DRAINAGE ABSCESS LEFT FOOT AND FOURTH METATARSOPHALANGEAL BONE RESECTION AND MANUAL MANIPULATION OF SECOND AND THIRD TOES;  Surgeon: Margrette Taft BRAVO, MD;  Location: [facility];  Service: Orthopedics;  Laterality: Left;   INCISION AND DRAINAGE ABSCESS Left 04/02/2023   Procedure: INCISION AND DRAINAGE ABSCESS;  Surgeon: Margrette Taft BRAVO,  MD;  Location: [facility];  Service: Orthopedics;  Laterality: Left;  left foot   LUMBAR LAMINECTOMY/DECOMPRESSION MICRODISCECTOMY  02/18/2012   Procedure: LUMBAR LAMINECTOMY/DECOMPRESSION MICRODISCECTOMY 1 LEVEL;  Surgeon: Arley SHAUNNA Helling, MD;  Location: [facility];  Service: Neurosurgery;  Laterality: Bilateral;  Bilateral Lumbar Laminectomy and diskectomy   Social History   Occupational History   Not on file  Tobacco Use   Smoking status: Never   Smokeless tobacco: Never  Vaping Use   Vaping status: Never Used  Substance and Sexual Activity   Alcohol use: No   Drug use: No   Sexual activity: Not on file

## 2023-07-16 ENCOUNTER — Encounter: Payer: Self-pay | Admitting: Orthopedic Surgery

## 2023-07-18 ENCOUNTER — Encounter: Payer: Self-pay | Admitting: Orthopedic Surgery

## 2023-07-18 ENCOUNTER — Encounter: Payer: BC Managed Care – PPO | Admitting: Orthopedic Surgery

## 2023-07-18 ENCOUNTER — Ambulatory Visit (INDEPENDENT_AMBULATORY_CARE_PROVIDER_SITE_OTHER): Payer: BC Managed Care – PPO | Admitting: Orthopedic Surgery

## 2023-07-18 DIAGNOSIS — Z89432 Acquired absence of left foot: Secondary | ICD-10-CM

## 2023-07-18 NOTE — Progress Notes (Signed)
Office Visit Note   Patient: Jerome Sutton           Date of Birth: 10/15/1980           MRN: [id number] Visit Date: 07/18/2023              Requested by: No referring provider defined for this encounter. PCP: Patient, No Pcp Per  Chief Complaint  Patient presents with   Left Foot - Routine Post Op    06/07/2023 left TMA 5th ray amputation      HPI: Patient is a 43 year old gentleman who is seen 6 weeks status post left transmetatarsal amputation and fifth ray amputation.  Patient has a follow-up appoint with [facility] for custom orthotics spacer and carbon plate.  Assessment & Plan: Visit Diagnoses:  1. History of transmetatarsal amputation of left foot (HCC)     Plan: Patient will increase his activities as tolerated.  Recommended compression and elevation for the swelling.  Follow-Up Instructions: Return if symptoms worsen or fail to improve.   Ortho Exam  Patient is alert, oriented, no adenopathy, well-dressed, normal affect, normal respiratory effort. Patient is seen in follow-up 6 weeks status post midfoot amputation on the left.  There is increased swelling the incisions are well-healed there is no cellulitis no drainage.  Patient is currently ambulating with a walker.  Imaging: No results found.   Labs: Lab Results  Component Value Date   HGBA1C 4.9 04/02/2023   ESRSEDRATE 9 05/09/2023   ESRSEDRATE 22 (H) 04/19/2023   ESRSEDRATE 48 (H) 04/02/2023   CRP <3.0 05/09/2023   CRP <3.0 04/19/2023   CRP 0.7 04/02/2023   LABURIC 5.0 07/03/2017   REPTSTATUS 04/11/2023 FINAL 04/02/2023   GRAMSTAIN  04/02/2023    RARE WBC PRESENT,BOTH PMN AND MONONUCLEAR NO ORGANISMS SEEN    CULT  04/02/2023    FEW STAPHYLOCOCCUS AUREUS FEW STAPHYLOCOCCUS EPIDERMIDIS NO ANAEROBES ISOLATED Performed at [facility], [address], [place], [place] [address]    [facility] STAPHYLOCOCCUS AUREUS 04/02/2023   LABORGA STAPHYLOCOCCUS EPIDERMIDIS 04/02/2023     Lab Results   Component Value Date   ALBUMIN 3.2 (L) 04/01/2023    No results found for: "MG" No results found for: "VD25OH"  No results found for: "PREALBUMIN"    Latest Ref Rng & Units 05/09/2023    9:31 AM 04/19/2023    9:44 AM 04/02/2023    5:07 AM  CBC EXTENDED  WBC 3.8 - 10.8 Thousand/uL 3.0  6.6  4.3   RBC 4.20 - 5.80 Million/uL 4.93  4.69  4.41   Hemoglobin 13.2 - 17.1 g/dL 13.2  44.0  10.2   HCT 38.5 - 50.0 % 44.5  42.0  40.1   Platelets 140 - 400 Thousand/uL 201  188  305   NEUT# 1,500 - 7,800 cells/uL 1,839        There is no height or weight on file to calculate BMI.  Orders:  No orders of the defined types were placed in this encounter.  No orders of the defined types were placed in this encounter.    Procedures: No procedures performed  Clinical Data: No additional findings.  ROS:  All other systems negative, except as noted in the HPI. Review of Systems  Objective: Vital Signs: There were no vitals taken for this visit.  Specialty Comments:  No specialty comments available.  PMFS History: Patient Active Problem List   Diagnosis Date Noted   Medication management 04/19/2023   Acute osteomyelitis  of foot (HCC) 04/01/2023   Septic arthritis of left foot (HCC) 04/01/2023   Osteomyelitis of left foot (HCC) 07/04/2017   Abscess of foot left s/p I and D 07/04/17 07/03/2017   History reviewed. No pertinent past medical history.  History reviewed. No pertinent family history.  Past Surgical History:  Procedure Laterality Date   AMPUTATION Left 06/07/2023   Procedure: LEFT TRANSMETATARSAL AMPUTATION WITH AMPUTATION OF LEFT 5TH METATARSAL;  Surgeon: Nadara Mustard, MD;  Location: [facility] OR;  Service: Orthopedics;  Laterality: Left;   BACK SURGERY     INCISION AND DRAINAGE ABSCESS Left 07/04/2017   Procedure: INCISION AND DRAINAGE ABSCESS LEFT FOOT AND FOURTH METATARSOPHALANGEAL BONE RESECTION AND MANUAL MANIPULATION OF SECOND AND THIRD TOES;  Surgeon: Vickki Hearing, MD;  Location: [facility];  Service: Orthopedics;  Laterality: Left;   INCISION AND DRAINAGE ABSCESS Left 04/02/2023   Procedure: INCISION AND DRAINAGE ABSCESS;  Surgeon: Vickki Hearing, MD;  Location: [facility];  Service: Orthopedics;  Laterality: Left;  left foot   LUMBAR LAMINECTOMY/DECOMPRESSION MICRODISCECTOMY  02/18/2012   Procedure: LUMBAR LAMINECTOMY/DECOMPRESSION MICRODISCECTOMY 1 LEVEL;  Surgeon: Mariam Dollar, MD;  Location: [facility];  Service: Neurosurgery;  Laterality: Bilateral;  Bilateral Lumbar Laminectomy and diskectomy   Social History   Occupational History   Not on file  Tobacco Use   Smoking status: Never   Smokeless tobacco: Never  Vaping Use   Vaping status: Never Used  Substance and Sexual Activity   Alcohol use: No   Drug use: No   Sexual activity: Not on file

## 2024-04-27 ENCOUNTER — Encounter: Payer: Self-pay | Admitting: Radiology
